# Patient Record
Sex: Female | Born: 1985 | Race: White | Hispanic: No | Marital: Married | Smoking: Former smoker
Health system: Southern US, Community
[De-identification: ages and names within clinical notes are randomized; demographics above are authoritative.]

## PROBLEM LIST (undated history)

## (undated) ENCOUNTER — Inpatient Hospital Stay (HOSPITAL_COMMUNITY): Payer: 59

## (undated) DIAGNOSIS — R0602 Shortness of breath: Secondary | ICD-10-CM

## (undated) DIAGNOSIS — Z309 Encounter for contraceptive management, unspecified: Secondary | ICD-10-CM

## (undated) DIAGNOSIS — M549 Dorsalgia, unspecified: Secondary | ICD-10-CM

## (undated) DIAGNOSIS — R110 Nausea: Secondary | ICD-10-CM

## (undated) DIAGNOSIS — IMO0001 Reserved for inherently not codable concepts without codable children: Secondary | ICD-10-CM

## (undated) DIAGNOSIS — R42 Dizziness and giddiness: Secondary | ICD-10-CM

## (undated) DIAGNOSIS — R109 Unspecified abdominal pain: Secondary | ICD-10-CM

## (undated) DIAGNOSIS — D1800 Hemangioma unspecified site: Secondary | ICD-10-CM

## (undated) DIAGNOSIS — F329 Major depressive disorder, single episode, unspecified: Secondary | ICD-10-CM

## (undated) HISTORY — DX: Hemangioma unspecified site: D18.00

## (undated) HISTORY — DX: Dorsalgia, unspecified: M54.9

## (undated) HISTORY — DX: Shortness of breath: R06.02

## (undated) HISTORY — DX: Encounter for contraceptive management, unspecified: Z30.9

## (undated) HISTORY — DX: Unspecified abdominal pain: R10.9

## (undated) HISTORY — DX: Reserved for inherently not codable concepts without codable children: IMO0001

## (undated) HISTORY — DX: Nausea: R11.0

## (undated) HISTORY — PX: WISDOM TOOTH EXTRACTION: SHX21

## (undated) HISTORY — DX: Major depressive disorder, single episode, unspecified: F32.9

## (undated) HISTORY — DX: Dizziness and giddiness: R42

---

## 2006-10-15 ENCOUNTER — Other Ambulatory Visit: Admission: RE | Admit: 2006-10-15 | Discharge: 2006-10-15 | Payer: Self-pay | Admitting: Obstetrics and Gynecology

## 2007-10-26 ENCOUNTER — Ambulatory Visit (HOSPITAL_COMMUNITY): Admission: RE | Admit: 2007-10-26 | Discharge: 2007-10-26 | Payer: Self-pay | Admitting: Obstetrics & Gynecology

## 2007-11-02 ENCOUNTER — Other Ambulatory Visit: Admission: RE | Admit: 2007-11-02 | Discharge: 2007-11-02 | Payer: Self-pay | Admitting: Obstetrics and Gynecology

## 2008-11-07 ENCOUNTER — Other Ambulatory Visit: Admission: RE | Admit: 2008-11-07 | Discharge: 2008-11-07 | Payer: Self-pay | Admitting: Obstetrics and Gynecology

## 2010-10-29 ENCOUNTER — Other Ambulatory Visit (HOSPITAL_COMMUNITY)
Admission: RE | Admit: 2010-10-29 | Discharge: 2010-10-29 | Disposition: A | Discharge status: Home / Self Care | Payer: PRIVATE HEALTH INSURANCE | Source: Ambulatory Visit | Attending: Obstetrics and Gynecology | Admitting: Obstetrics and Gynecology

## 2010-10-29 ENCOUNTER — Other Ambulatory Visit: Payer: Self-pay | Admitting: Adult Health

## 2010-10-29 DIAGNOSIS — Z01419 Encounter for gynecological examination (general) (routine) without abnormal findings: Secondary | ICD-10-CM | POA: Insufficient documentation

## 2012-03-09 ENCOUNTER — Other Ambulatory Visit: Payer: Self-pay | Admitting: Adult Health

## 2012-03-09 ENCOUNTER — Other Ambulatory Visit (HOSPITAL_COMMUNITY)
Admission: RE | Admit: 2012-03-09 | Discharge: 2012-03-09 | Disposition: A | Discharge status: Home / Self Care | Payer: BC Managed Care – PPO | Source: Ambulatory Visit | Attending: Obstetrics and Gynecology | Admitting: Obstetrics and Gynecology

## 2012-03-09 DIAGNOSIS — Z01419 Encounter for gynecological examination (general) (routine) without abnormal findings: Secondary | ICD-10-CM | POA: Insufficient documentation

## 2012-04-02 ENCOUNTER — Other Ambulatory Visit: Payer: Self-pay | Admitting: Adult Health

## 2012-09-25 ENCOUNTER — Telehealth: Payer: Self-pay | Admitting: Adult Health

## 2012-09-25 MED ORDER — NITROFURANTOIN MONOHYD MACRO 100 MG PO CAPS: 100.0000 mg | ORAL_CAPSULE | Freq: Two times a day (BID) | ORAL | Status: DC

## 2012-09-25 NOTE — Telephone Encounter (Signed)
Complains of UTI rx macrobid

## 2013-03-15 ENCOUNTER — Encounter (INDEPENDENT_AMBULATORY_CARE_PROVIDER_SITE_OTHER): Payer: Self-pay

## 2013-03-15 ENCOUNTER — Other Ambulatory Visit: Payer: Self-pay | Admitting: Adult Health

## 2013-03-29 ENCOUNTER — Other Ambulatory Visit: Payer: Self-pay | Admitting: Adult Health

## 2013-04-01 ENCOUNTER — Other Ambulatory Visit: Payer: Self-pay | Admitting: Obstetrics and Gynecology

## 2013-04-19 ENCOUNTER — Other Ambulatory Visit: Payer: Self-pay | Admitting: Adult Health

## 2013-05-03 ENCOUNTER — Encounter: Payer: Self-pay | Admitting: Adult Health

## 2013-05-03 ENCOUNTER — Ambulatory Visit (INDEPENDENT_AMBULATORY_CARE_PROVIDER_SITE_OTHER): Payer: 59 | Admitting: Adult Health

## 2013-05-03 VITALS — BP 120/76 | HR 74 | Ht 65.0 in | Wt 174.0 lb

## 2013-05-03 DIAGNOSIS — Z01419 Encounter for gynecological examination (general) (routine) without abnormal findings: Secondary | ICD-10-CM

## 2013-05-03 DIAGNOSIS — Z3202 Encounter for pregnancy test, result negative: Secondary | ICD-10-CM

## 2013-05-03 DIAGNOSIS — Z309 Encounter for contraceptive management, unspecified: Secondary | ICD-10-CM

## 2013-05-03 DIAGNOSIS — Z3200 Encounter for pregnancy test, result unknown: Secondary | ICD-10-CM

## 2013-05-03 HISTORY — DX: Encounter for contraceptive management, unspecified: Z30.9

## 2013-05-03 LAB — POCT URINE PREGNANCY: Preg Test, Ur: NEGATIVE

## 2013-05-03 MED ORDER — NORGESTIM-ETH ESTRAD TRIPHASIC 0.18/0.215/0.25 MG-35 MCG PO TABS
ORAL_TABLET | ORAL | Status: DC
Start: 2013-05-03 — End: 2014-04-06

## 2013-05-03 NOTE — Progress Notes (Signed)
Patient ID: Gloria Alvarado, female   DOB: 1985-01-29, 28 y.o.   MRN: [id number] History of Present Illness: Gloria Alvarado is a 28 year old white female, married in for a physical,she had a normal pap 03/09/12.She has been off OCs due to pharmacy issue.   Current Medications, Allergies, Past Medical History, Past Surgical History, Family History and Social History were reviewed in [employer] record.     Review of Systems: Patient denies any headaches, blurred vision, shortness of breath, chest pain, abdominal pain, problems with bowel movements, urination, or intercourse.  No joint pain or mood swings, has had some occasional dizziness with changing positions. Had area right side,felt like mobile nodule, not there now.  Physical Exam:BP 120/76  Pulse 74  Ht 5\' 5"  (1.651 m)  Wt 174 lb (78.926 kg)  BMI 28.96 kg/m2  LMP 04/20/2015UPT negative General:  Well developed, well nourished, no acute distress Skin:  Warm and dry Neck:  Midline trachea, normal thyroid Lungs; Clear to auscultation bilaterally Breast:  No dominant palpable mass, retraction, or nipple discharge Cardiovascular: Regular rate and rhythm Abdomen:  Soft, non tender, no hepatosplenomegaly Pelvic:  External genitalia is normal in appearance.  The vagina is normal in appearance, except for period like blood.. The cervix is tiny.  Uterus is felt to be normal size, shape, and contour.  No                adnexal masses or tenderness noted. Extremities:  No swelling or varicosities noted Psych:  No mood changes, alert and cooperative, seems happy   Impression: Yearly gyn exam no pap Contraceptive management    Plan: Refilled tri sprintec x 1 year OK to start today use backup x 1 month Physical in 1 year and pap then

## 2013-05-03 NOTE — Patient Instructions (Signed)
Physical in  1 year  Start OCs today

## 2013-06-11 ENCOUNTER — Telehealth: Payer: Self-pay | Admitting: Adult Health

## 2013-06-11 MED ORDER — METRONIDAZOLE 500 MG PO TABS: 500.0000 mg | ORAL_TABLET | Freq: Two times a day (BID) | ORAL | Status: DC

## 2013-06-11 NOTE — Telephone Encounter (Signed)
Complains of BV like symptoms will rx flagyl

## 2014-04-06 ENCOUNTER — Other Ambulatory Visit: Payer: Self-pay | Admitting: Adult Health

## 2014-04-07 ENCOUNTER — Telehealth: Payer: Self-pay | Admitting: Adult Health

## 2014-04-07 NOTE — Telephone Encounter (Signed)
Left message letting pt know birth control refill was sent to pharmacy today. [place]

## 2014-04-25 ENCOUNTER — Other Ambulatory Visit: Payer: Self-pay | Admitting: Adult Health

## 2014-05-09 ENCOUNTER — Other Ambulatory Visit: Payer: Self-pay | Admitting: Adult Health

## 2014-05-23 ENCOUNTER — Other Ambulatory Visit (HOSPITAL_COMMUNITY)
Admission: RE | Admit: 2014-05-23 | Discharge: 2014-05-23 | Disposition: A | Discharge status: Home / Self Care | Payer: 59 | Source: Ambulatory Visit | Attending: Obstetrics and Gynecology | Admitting: Obstetrics and Gynecology

## 2014-05-23 ENCOUNTER — Ambulatory Visit (INDEPENDENT_AMBULATORY_CARE_PROVIDER_SITE_OTHER): Payer: 59 | Admitting: Adult Health

## 2014-05-23 ENCOUNTER — Encounter: Payer: Self-pay | Admitting: Adult Health

## 2014-05-23 VITALS — BP 108/78 | HR 64 | Ht 65.0 in | Wt 176.0 lb

## 2014-05-23 DIAGNOSIS — Z01419 Encounter for gynecological examination (general) (routine) without abnormal findings: Secondary | ICD-10-CM | POA: Diagnosis present

## 2014-05-23 DIAGNOSIS — Z3041 Encounter for surveillance of contraceptive pills: Secondary | ICD-10-CM

## 2014-05-23 MED ORDER — NORGESTIM-ETH ESTRAD TRIPHASIC 0.18/0.215/0.25 MG-35 MCG PO TABS: 1.0000 | ORAL_TABLET | Freq: Every day | ORAL | Status: DC

## 2014-05-23 NOTE — Progress Notes (Signed)
Patient ID: Gloria Alvarado, female   DOB: November 26, 1985, 29 y.o.   MRN: [id number] History of Present Illness:  Gloria Alvarado is a 29 year old white female,married in for well woman gyn exam and pap.  Current Medications, Allergies, Past Medical History, Past Surgical History, Family History and Social History were reviewed in [employer] record.     Review of Systems: Patient denies any headaches, hearing loss, fatigue, blurred vision, shortness of breath, chest pain, abdominal pain, problems with bowel movements, urination, or intercourse. No joint pain or mood swings.She may want to try to get pregnant in the Fall.    Physical Exam:BP 108/78 mmHg  Pulse 64  Ht 5\' 5"  (1.651 m)  Wt 176 lb (79.833 kg)  BMI 29.29 kg/m2  LMP 04/29/2014 General:  Well developed, well nourished, no acute distress Skin:  Warm and dry Neck:  Midline trachea, normal thyroid, good ROM, no lymphadenopathy Lungs; Clear to auscultation bilaterally Breast:  No dominant palpable mass, retraction, or nipple discharge Cardiovascular: Regular rate and rhythm Abdomen:  Soft, non tender, no hepatosplenomegaly Pelvic:  External genitalia is normal in appearance, no lesions.  The vagina is normal in appearance. Urethra has no lesions or masses. The cervix is smooth, pap perofrmed.  Uterus is felt to be normal size, shape, and contour.  No adnexal masses or tenderness noted.Bladder is non tender, no masses felt. Extremities/musculoskeletal:  No swelling or varicosities noted, no clubbing or cyanosis Psych:  No mood changes, alert and cooperative,seems happy   Impression: Well woman gyn exam with pap Contraceptive management    Plan: Refilled tri sprintec x 1 year Physical in  1 year, pap at 30 Take folic acid or OTC PNV Stop OCs 3 months before starts trying to get pregnant, and have sex every other day day 7-24 of cycle, void before sex and lay there after sex

## 2014-05-23 NOTE — Patient Instructions (Signed)
Take folic acid  Physical in 1 year Pap at 30

## 2014-05-25 LAB — CYTOLOGY - PAP

## 2015-04-03 ENCOUNTER — Other Ambulatory Visit: Payer: Self-pay | Admitting: Adult Health

## 2015-04-17 ENCOUNTER — Ambulatory Visit (INDEPENDENT_AMBULATORY_CARE_PROVIDER_SITE_OTHER): Payer: 59 | Admitting: Adult Health

## 2015-04-17 ENCOUNTER — Ambulatory Visit (HOSPITAL_COMMUNITY)
Admission: RE | Admit: 2015-04-17 | Discharge: 2015-04-17 | Disposition: A | Discharge status: Home / Self Care | Payer: PRIVATE HEALTH INSURANCE | Source: Ambulatory Visit | Attending: Adult Health | Admitting: Adult Health

## 2015-04-17 ENCOUNTER — Encounter: Payer: Self-pay | Admitting: Adult Health

## 2015-04-17 VITALS — BP 132/82 | HR 76 | Ht 66.0 in | Wt 182.0 lb

## 2015-04-17 DIAGNOSIS — R109 Unspecified abdominal pain: Secondary | ICD-10-CM

## 2015-04-17 DIAGNOSIS — R143 Flatulence: Secondary | ICD-10-CM | POA: Diagnosis not present

## 2015-04-17 DIAGNOSIS — R110 Nausea: Secondary | ICD-10-CM | POA: Diagnosis not present

## 2015-04-17 DIAGNOSIS — Z3202 Encounter for pregnancy test, result negative: Secondary | ICD-10-CM

## 2015-04-17 DIAGNOSIS — R0602 Shortness of breath: Secondary | ICD-10-CM

## 2015-04-17 DIAGNOSIS — F329 Major depressive disorder, single episode, unspecified: Secondary | ICD-10-CM | POA: Diagnosis not present

## 2015-04-17 DIAGNOSIS — R1084 Generalized abdominal pain: Secondary | ICD-10-CM

## 2015-04-17 DIAGNOSIS — R42 Dizziness and giddiness: Secondary | ICD-10-CM | POA: Diagnosis not present

## 2015-04-17 DIAGNOSIS — F32A Depression, unspecified: Secondary | ICD-10-CM

## 2015-04-17 DIAGNOSIS — IMO0001 Reserved for inherently not codable concepts without codable children: Secondary | ICD-10-CM

## 2015-04-17 DIAGNOSIS — F418 Other specified anxiety disorders: Secondary | ICD-10-CM | POA: Insufficient documentation

## 2015-04-17 HISTORY — DX: Depression, unspecified: F32.A

## 2015-04-17 HISTORY — DX: Nausea: R11.0

## 2015-04-17 HISTORY — DX: Reserved for inherently not codable concepts without codable children: IMO0001

## 2015-04-17 HISTORY — DX: Shortness of breath: R06.02

## 2015-04-17 HISTORY — DX: Unspecified abdominal pain: R10.9

## 2015-04-17 HISTORY — DX: Dizziness and giddiness: R42

## 2015-04-17 LAB — POCT URINE PREGNANCY: Preg Test, Ur: NEGATIVE

## 2015-04-17 MED ORDER — ESCITALOPRAM OXALATE 10 MG PO TABS: 10.0000 mg | ORAL_TABLET | Freq: Every day | ORAL | Status: DC

## 2015-04-17 NOTE — Patient Instructions (Addendum)
Take lexapro daily Follow up in 1 week  Will talk when labs back Get chest xray and [place] Go to ER if worse

## 2015-04-17 NOTE — Progress Notes (Signed)
Subjective:     Patient ID: Gloria Alvarado, female   DOB: 07-07-1985, 30 y.o.   MRN: [id number]  HPI Gloria Alvarado is a 30 year old white female worked in today, complaining of dizzy and head feels cloudy for 4-5 months, has had sinus issues and allergies, and has nausea and stomach pain and cramps for 4-5 weeks, has had gas and some heartburn and feels short of breath at times for 1-2 weeks, BMs are more often, has not seen and blood. Has normal stress, nothing new. Has had bleeding at navel for last week or so.   Review of Systems Patient denies any headaches, hearing loss, fatigue, blurred vision, chest pain, problems with bowel movements, urination, or intercourse. No joint pain or mood swings. See HPI for positives. Reviewed past medical,surgical, social and family history. Reviewed medications and allergies.     Objective:   Physical Exam BP 132/82 mmHg  Pulse 76  Ht 5\' 6"  (QA348G m)  Wt 182 lb (82.555 kg)  BMI 29.39 kg/m2  LMP 04/05/2015 UPT negative Skin warm and dry. Neck: mid line trachea, normal thyroid, good ROM, no lymphadenopathy noted. Lungs: clear to ausculation bilaterally. Cardiovascular: regular rate and rhythm. No sinus tenderness CN 2-12 intact, Abdomen soft and non tender, no HSM, has old blood in navel, no odor. PHQ 9 score 12, denies suicidal ideations. Discussed that will need to check labs and get [place] and chest xray, if all negative may get cardiology consult, but will treat depression with lexapro to see if any other symptoms improve. Face time 25 minutes, with 50 % get tests scheduled and counseling.    Assessment:     Nausea Dizzy spells Abdominal pain Short of breath Gas Depression     Plan:     Check CBC,CMP,TSH and amylase and lipase Abdominal and pelvic US 4/5 at 2 pm at [facility] Get chest xray today Rx Lexapro 10 mg 330 take 1 daily with 6 refills Follow up in 1 week If gets worse go to ER

## 2015-04-18 ENCOUNTER — Telehealth: Payer: Self-pay | Admitting: Adult Health

## 2015-04-18 LAB — COMPREHENSIVE METABOLIC PANEL
A/G RATIO: 1.5 (ref 1.2–2.2)
ALK PHOS: 75 IU/L (ref 39–117)
ALT: 23 IU/L (ref 0–32)
AST: 18 IU/L (ref 0–40)
Albumin: 4.4 g/dL (ref 3.5–5.5)
BILIRUBIN TOTAL: 0.6 mg/dL (ref 0.0–1.2)
BUN/Creatinine Ratio: 11 (ref 9–23)
BUN: 8 mg/dL (ref 6–20)
CHLORIDE: 100 mmol/L (ref 96–106)
CO2: 21 mmol/L (ref 18–29)
Calcium: 9.3 mg/dL (ref 8.7–10.2)
Creatinine, Ser: 0.72 mg/dL (ref 0.57–1.00)
GFR calc Af Amer: 131 mL/min/{1.73_m2} (ref >59)
GFR calc non Af Amer: 114 mL/min/{1.73_m2} (ref >59)
GLUCOSE: 74 mg/dL (ref 65–99)
Globulin, Total: 3 g/dL (ref 1.5–4.5)
POTASSIUM: 4.2 mmol/L (ref 3.5–5.2)
Sodium: 141 mmol/L (ref 134–144)
Total Protein: 7.4 g/dL (ref 6.0–8.5)

## 2015-04-18 LAB — TSH: TSH: 3.01 u[IU]/mL (ref 0.450–4.500)

## 2015-04-18 LAB — CBC
Hematocrit: 40.8 % (ref 34.0–46.6)
Hemoglobin: 13.9 g/dL (ref 11.1–15.9)
MCH: 30.5 pg (ref 26.6–33.0)
MCHC: 34.1 g/dL (ref 31.5–35.7)
MCV: 90 fL (ref 79–97)
PLATELETS: 250 10*3/uL (ref 150–379)
RBC: 4.55 x10E6/uL (ref 3.77–5.28)
RDW: 13.2 % (ref 12.3–15.4)
WBC: 5.6 10*3/uL (ref 3.4–10.8)

## 2015-04-18 LAB — AMYLASE: Amylase: 76 U/L (ref 31–124)

## 2015-04-18 LAB — LIPASE: LIPASE: 23 U/L (ref 0–59)

## 2015-04-18 NOTE — Telephone Encounter (Signed)
Pt aware chest xray and labs all normal, has [place] tomorrow

## 2015-04-19 ENCOUNTER — Ambulatory Visit (HOSPITAL_COMMUNITY)
Admission: RE | Admit: 2015-04-19 | Discharge: 2015-04-19 | Disposition: A | Discharge status: Home / Self Care | Payer: 59 | Source: Ambulatory Visit | Attending: Adult Health | Admitting: Adult Health

## 2015-04-19 ENCOUNTER — Ambulatory Visit (HOSPITAL_COMMUNITY): Payer: 59

## 2015-04-19 DIAGNOSIS — R110 Nausea: Secondary | ICD-10-CM | POA: Diagnosis present

## 2015-04-19 DIAGNOSIS — D1800 Hemangioma unspecified site: Secondary | ICD-10-CM | POA: Diagnosis not present

## 2015-04-19 DIAGNOSIS — R1084 Generalized abdominal pain: Secondary | ICD-10-CM | POA: Diagnosis present

## 2015-04-20 ENCOUNTER — Telehealth: Payer: Self-pay | Admitting: Adult Health

## 2015-04-20 DIAGNOSIS — R0602 Shortness of breath: Secondary | ICD-10-CM

## 2015-04-20 DIAGNOSIS — D1803 Hemangioma of intra-abdominal structures: Secondary | ICD-10-CM

## 2015-04-20 NOTE — Telephone Encounter (Signed)
Pt aware of [place] results,has some heartburn, try prilosec, wil recheck abd Korea in 1 year and will refer to cardiology for shortness of breath evaluation

## 2015-04-24 ENCOUNTER — Encounter: Payer: Self-pay | Admitting: Adult Health

## 2015-04-24 ENCOUNTER — Ambulatory Visit (INDEPENDENT_AMBULATORY_CARE_PROVIDER_SITE_OTHER): Payer: 59 | Admitting: Adult Health

## 2015-04-24 VITALS — BP 120/70 | HR 64 | Ht 66.0 in | Wt 182.5 lb

## 2015-04-24 DIAGNOSIS — F329 Major depressive disorder, single episode, unspecified: Secondary | ICD-10-CM | POA: Diagnosis not present

## 2015-04-24 DIAGNOSIS — R0602 Shortness of breath: Secondary | ICD-10-CM | POA: Diagnosis not present

## 2015-04-24 DIAGNOSIS — R12 Heartburn: Secondary | ICD-10-CM | POA: Insufficient documentation

## 2015-04-24 DIAGNOSIS — D1803 Hemangioma of intra-abdominal structures: Secondary | ICD-10-CM

## 2015-04-24 DIAGNOSIS — F32A Depression, unspecified: Secondary | ICD-10-CM

## 2015-04-24 DIAGNOSIS — R110 Nausea: Secondary | ICD-10-CM | POA: Diagnosis not present

## 2015-04-24 NOTE — Progress Notes (Signed)
Subjective:     Patient ID: Gloria Alvarado, female   DOB: 1985-05-09, 30 y.o.   MRN: [id number]  HPI Gloria Alvarado is a 30 year old white female, married back in follow up of having multiple symptoms, last week, such as heartburn,which is better since taking prilosec, but still has nausea in am, still has some shortness of breath, but thinks it is better too.   Review of Systems  Patient denies any headaches, hearing loss, fatigue, blurred vision, chest pain, abdominal pain, problems with bowel movements, urination, or intercourse. No joint pain or mood swings.See HPI for positives. Reviewed past medical,surgical, social and family history. Reviewed medications and allergies.     Objective:   Physical Exam BP 120/70 mmHg  Pulse 64  Ht 5\' 6"  (1.676 m)  Wt 182 lb 8 oz (82.781 kg)  BMI 29.47 kg/m2  LMP 04/05/2015 Skin warm and dry. Lungs: clear to ausculation bilaterally. Cardiovascular: regular rate and rhythm.She says she is feeling better, but still has nausea in am, reviewed [place] and chest xray again with her and her Mom this time, called cardiology as she has not heard from them yet about appt.Will get [place] of liver in 1 year to assess hemangioma of liver stability and if heartburn and nausea do not resolve will check liver function. Face time 15 minutes with 50% counseling.    Assessment:     Heartburn symptoms Nausea Shortness of breath Depression    Hemangioma of liver   Plan:     Continue lexapro Continue prilosec, if not better after 7 days can increase to 20 mg and let me know Keep appt 5/15 for physical Awaiting call from cardiology

## 2015-04-24 NOTE — Patient Instructions (Signed)
Keep taking lexapro  Continue prilosec Keep appt 5/15

## 2015-04-25 ENCOUNTER — Telehealth: Payer: Self-pay | Admitting: *Deleted

## 2015-04-26 ENCOUNTER — Ambulatory Visit (INDEPENDENT_AMBULATORY_CARE_PROVIDER_SITE_OTHER): Payer: 59 | Admitting: Cardiology

## 2015-04-26 ENCOUNTER — Encounter: Payer: Self-pay | Admitting: Cardiology

## 2015-04-26 VITALS — BP 110/64 | HR 86 | Ht 66.0 in | Wt 187.0 lb

## 2015-04-26 DIAGNOSIS — J302 Other seasonal allergic rhinitis: Secondary | ICD-10-CM | POA: Diagnosis not present

## 2015-04-26 DIAGNOSIS — R0602 Shortness of breath: Secondary | ICD-10-CM

## 2015-04-26 NOTE — Patient Instructions (Signed)
Your physician recommends that you schedule a follow-up appointment in: to be determined after echo    Your physician has requested that you have an echocardiogram. Echocardiography is a painless test that uses sound waves to create images of your heart. It provides your doctor with information about the size and shape of your heart and how well your heart's chambers and valves are working. This procedure takes approximately one hour. There are no restrictions for this procedure.         Thank you for choosing [facility] !         

## 2015-04-26 NOTE — Progress Notes (Signed)
Cardiology Office Note  Date: 04/26/2015   ID: Gloria Alvarado, DOB [birth year], MRN [id number]  PCP: Derrek Monaco, NP  Consulting Cardiologist: Rozann Lesches, MD   Chief Complaint  Patient presents with  . Shortness of Breath    History of Present Illness: Gloria Alvarado is a 30 y.o. female referred for cardiology consultation by Ms. Laurann Montana NP. I reviewed her records including office note from April 10 at OB/GYN visit. She is here today with her mother to discuss symptoms. Within the last few weeks she has felt a general sense that she needs to take a deep breath periodically. This can happen when she is seated, more noticeable when she is talking a lot to someone or active and moving around. It is almost as if she has to sigh relatively frequently. 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 She reports being under stress. She does not endorse any exertional chest pain. She occasionally feels somewhat lightheaded when she stands up quickly, has had no distinct palpitations or syncope.  We discussed her family history, no premature CAD, sudden cardiac death, known arrhythmias, or valvular heart disease. She does not have any personal history of elevated blood pressure, diabetes mellitus, or hyperlipidemia.  ECG done today shows sinus rhythm with borderline low voltage.  She has no definite history of asthma, but does report seasonal allergies and "congestion" at least twice a year. She has had recent trouble with allergy symptoms.  Past Medical History  Diagnosis Date  . Contraceptive management 05/03/2013  . Dizzy spells 04/17/2015  . Nausea 04/17/2015  . Abdominal pain 04/17/2015  . Shortness of breath 04/17/2015  . Gas 04/17/2015  . Depression 04/17/2015    Past Surgical History  Procedure Laterality Date  . Wisdom tooth extraction      Current Outpatient Prescriptions  Medication Sig Dispense Refill  . escitalopram (LEXAPRO) 10  MG tablet Take 1 tablet (10 mg total) by mouth daily. 30 tablet 6  . GuaiFENesin (MUCINEX PO) Take by mouth as needed.    Marland Kitchen ibuprofen (ADVIL,MOTRIN) 200 MG tablet Take 400 mg by mouth as needed.    . Omeprazole (PRILOSEC PO) Take by mouth daily.    . Pediatric Multiple Vit-C-FA (FLINSTONES GUMMIES OMEGA-3 DHA PO) Take by mouth. Takes 2 daily    . Probiotic Product (PROBIOTIC PO) Take by mouth daily.    . TRI-SPRINTEC 0.18/0.215/0.25 MG-35 MCG tablet TAKE ONE TABLET BY MOUTH ONCE DAILY 28 tablet 11  . UNABLE TO FIND Plexus drink-daily     No current facility-administered medications for this visit.   Allergies:  Sulfa antibiotics   Social History: The patient  reports that she quit smoking about 10 years ago. Her smoking use included Cigarettes. She started smoking about 15 years ago. She smoked 0.25 packs per day. She has never used smokeless tobacco. She reports that she drinks alcohol. She reports that she does not use illicit drugs.   Family History: The patient's family history includes Cancer in her maternal grandfather; Hyperlipidemia in her mother; Hypertension in her mother and sister; Prostate cancer in her father; Thyroid disease in her maternal grandmother.   ROS:  Please see the history of present illness. Otherwise, complete review of systems is positive for intermittent abdominal discomfort.  All other systems are reviewed and negative.   Physical Exam: VS:  BP 110/64 mmHg  Pulse 86  Ht 5\' 6"  (1.676 m)  Wt  187 lb (84.823 kg)  BMI 30.20 kg/m2  SpO2 98%  LMP 04/05/2015, BMI Body mass index is 30.2 kg/(m^2).  Wt Readings from Last 3 Encounters:  04/26/15 187 lb (84.823 kg)  04/24/15 182 lb 8 oz (82.781 kg)  04/17/15 182 lb (82.555 kg)    General: Overweight young woman, appears comfortable at rest. HEENT: Conjunctiva and lids normal, oropharynx clear. Neck: Supple, no elevated JVP or carotid bruits, no thyromegaly. Lungs: Clear to auscultation - no wheezing, nonlabored  breathing at rest. Cardiac: Regular rate and rhythm, no S3 or significant systolic murmur, no pericardial rub. Abdomen: Soft, nontender, bowel sounds present. Extremities: No pitting edema, distal pulses 2+. Skin: Warm and dry. Musculoskeletal: No kyphosis. Neuropsychiatric: Alert and oriented x3, affect grossly appropriate.  ECG: There is no old tracing for comparison.  Recent Labwork: 04/17/2015: ALT 23; AST 18; BUN 8; Creatinine, Ser 0.72; Platelets 250; Potassium 4.2; Sodium 141; TSH 3.010   Other Studies Reviewed Today:  Chest x-ray 04/17/2015: FINDINGS: The cardiomediastinal silhouette is unremarkable.  There is no evidence of focal airspace disease, pulmonary edema, suspicious pulmonary nodule/mass, pleural effusion, or pneumothorax. No acute bony abnormalities are identified.  IMPRESSION: No active cardiopulmonary disease.  Abdominal ultrasound 04/19/2015: FINDINGS: Gallbladder: No gallstones or wall thickening visualized. There is no pericholecystic fluid. No sonographic Murphy sign noted by sonographer.  Common bile duct: Diameter: 5 mm. There is no intrahepatic, common hepatic, or common bile duct dilatation.  Liver: There is an echogenic focus in the anterior segment of the right lobe of the liver measuring 1.1 x 2.4 x 1.6 cm. A second nearby echogenic focus measures 0.9 x 1.4 x 1.8 cm. No other focal liver lesions are evident. Within normal limits in parenchymal echogenicity.  IVC: No abnormality visualized.  Pancreas: No pancreatic mass or inflammatory focus.  Spleen: Size and appearance within normal limits.  Right Kidney: Length: 10.0 cm. Echogenicity within normal limits. No mass or hydronephrosis visualized.  Left Kidney: Length: 11.1 cm. Echogenicity within normal limits. No mass or hydronephrosis visualized.  Abdominal aorta: No aneurysm visualized.  Other findings: No demonstrable ascites.  IMPRESSION: Echogenic foci in the right  lobe of the liver, probably representing hemangiomas. It may be prudent to obtain a followup ultrasound of the liver in approximately 1 year to assess for stability.  Study otherwise unremarkable.  Assessment and Plan:  1. Relative sense of shortness of breath as outlined above. No exertional chest pain, palpitations, or syncope. ECG reviewed and nonspecific overall. She has no significant cardiac murmur on examination, no wheezing. Recent chest x-ray showed no acute process. We discussed her symptoms in detail today. Likelihood of ischemic heart disease as etiology would be quite low based on risk factor profile. From a cardiac perspective, main concerns at this point would be to exclude an undiagnosed cardiomyopathy or other structural cardiac abnormality as well as exclude pulmonary hypertension. This could be evaluated adequately by an echocardiogram which will be ordered. If this is reassuring, other possibilities can be considered in follow-up with Ms. Laurann Montana NP. Emperic treatment with antihistamine for allergy symptoms could be considered as well as possibly PFTs.  2. Reported intermittent allergy symptoms. I asked her to try over-the-counter Claritin to see it is making a difference in her symptoms.  Current medicines were reviewed with the patient today.   Orders Placed This Encounter  Procedures  . EKG 12-Lead  . Echocardiogram    Disposition: Call with results.   Signed, Satira Sark, MD, [facility] 04/26/2015 10:23  AM    [facility] at [facility] [address], [place], [place] [address] Phone: [phone]; Fax: [phone]

## 2015-05-03 ENCOUNTER — Other Ambulatory Visit (HOSPITAL_COMMUNITY): Payer: PRIVATE HEALTH INSURANCE

## 2015-05-08 NOTE — Telephone Encounter (Signed)
Done

## 2015-05-10 ENCOUNTER — Ambulatory Visit (HOSPITAL_COMMUNITY)
Admission: RE | Admit: 2015-05-10 | Discharge: 2015-05-10 | Disposition: A | Discharge status: Home / Self Care | Payer: PRIVATE HEALTH INSURANCE | Source: Ambulatory Visit | Attending: Cardiology | Admitting: Cardiology

## 2015-05-10 DIAGNOSIS — R0602 Shortness of breath: Secondary | ICD-10-CM | POA: Diagnosis present

## 2015-05-12 ENCOUNTER — Other Ambulatory Visit: Payer: Self-pay

## 2015-05-15 ENCOUNTER — Telehealth: Payer: Self-pay | Admitting: Adult Health

## 2015-05-15 DIAGNOSIS — K921 Melena: Secondary | ICD-10-CM

## 2015-05-15 NOTE — Telephone Encounter (Signed)
Feels better on prilosec, but has had blood in stool recently, and stomach hurts, will refer to Dr Laural Golden,

## 2015-05-16 ENCOUNTER — Encounter (INDEPENDENT_AMBULATORY_CARE_PROVIDER_SITE_OTHER): Payer: Self-pay | Admitting: *Deleted

## 2015-05-29 ENCOUNTER — Ambulatory Visit (INDEPENDENT_AMBULATORY_CARE_PROVIDER_SITE_OTHER): Payer: 59 | Admitting: Internal Medicine

## 2015-05-29 ENCOUNTER — Other Ambulatory Visit: Payer: 59 | Admitting: Adult Health

## 2015-05-29 ENCOUNTER — Encounter (INDEPENDENT_AMBULATORY_CARE_PROVIDER_SITE_OTHER): Payer: Self-pay | Admitting: Internal Medicine

## 2015-05-29 VITALS — BP 112/72 | HR 72 | Temp 98.2°F | Ht 66.0 in | Wt 182.0 lb

## 2015-05-29 DIAGNOSIS — R1011 Right upper quadrant pain: Secondary | ICD-10-CM | POA: Diagnosis not present

## 2015-05-29 NOTE — Progress Notes (Addendum)
Subjective:    Patient ID: Gloria Alvarado, female    DOB: [birth year], 30 y.o.   MRN: [id number]  HPI Referred by Derrek Monaco for rectal bleeding. She has noticed in the last few months. She noticed while wiping. She did have some rectal bleeding this morning. Usually has a BM daily. No pain with the rectal bleeding.  She now has noticed on the stool.  She says she has also seen blood in the toilet. No pain with the rectal bleeding. She also says she has left upper quadrant pain. She occasionally has a stabbing pain in her rt upper quadrant. She has seen Derrek Monaco NP. She was started on Prilosec. The Prilosec helped with the nausea and GERD. She has a lot of back pain at night. She also has tenderness to the touch at times. Back pain x 1 month.  Family hx of GB. Mother had her GB removed 35-40. Sister at age 31.  She does not eat past 7pm.  There has been no weight loss. No family hx of colon cancer or inflammatory bowel disease  CBC    Component Value Date/Time   WBC 5.6 04/17/2015 1316   RBC 4.55 04/17/2015 1316   HCT 40.8 04/17/2015 1316   PLT 250 04/17/2015 1316   MCV 90 04/17/2015 1316   MCH 30.5 04/17/2015 1316   MCHC 34.1 04/17/2015 1316   RDW 13.2 04/17/2015 1316       Review of Systems Past Medical History  Diagnosis Date  . Contraceptive management 05/03/2013  . Dizzy spells 04/17/2015  . Nausea 04/17/2015  . Abdominal pain 04/17/2015  . Shortness of breath 04/17/2015  . Gas 04/17/2015  . Depression 04/17/2015    Past Surgical History  Procedure Laterality Date  . Wisdom tooth extraction      Allergies  Allergen Reactions  . Sulfa Antibiotics Shortness Of Breath, Swelling and Other (See Comments)    Throat swelling, shortness of breath.    Current Outpatient Prescriptions on File Prior to Visit  Medication Sig Dispense Refill  . escitalopram (LEXAPRO) 10 MG tablet Take 1 tablet (10 mg total) by mouth daily. 30 tablet 6  . GuaiFENesin (MUCINEX PO) Take  by mouth as needed.    Marland Kitchen ibuprofen (ADVIL,MOTRIN) 200 MG tablet Take 400 mg by mouth as needed.    . Omeprazole (PRILOSEC PO) Take by mouth daily.    . Pediatric Multiple Vit-C-FA (FLINSTONES GUMMIES OMEGA-3 DHA PO) Take by mouth. Takes 2 daily    . Probiotic Product (PROBIOTIC PO) Take by mouth daily.    . TRI-SPRINTEC 0.18/0.215/0.25 MG-35 MCG tablet TAKE ONE TABLET BY MOUTH ONCE DAILY 28 tablet 11  . UNABLE TO FIND Plexus drink-daily     No current facility-administered medications on file prior to visit.        Objective:   Physical Exam Blood pressure 112/72, pulse 72, temperature 98.2 F (36.8 C), height 5\' 6"  (1.676 m), weight 182 lb (82.555 kg).  Alert and oriented. Skin warm and dry. Oral mucosa is moist.   . Sclera anicteric, conjunctivae is pink. Thyroid not enlarged. No cervical lymphadenopathy. Lungs clear. Heart regular rate and rhythm.  Abdomen is soft. Bowel sounds are positive. No hepatomegaly. No abdominal masses felt. No tenderness.  No edema to lower extremities.  Stool brown and guaiac negative.   Lot [id number] Ex 9/17     Assessment & Plan:  Rectal bleeding. Stool was negative. Am going to send three stool cards home with her.  If any positive, may need sigmoidoscopy.  Upper abdominal pain. Prilosec has helped. Needs to rule out GB disease. Family hx of GB disease.  Will get a HIDA scan.

## 2015-05-29 NOTE — Patient Instructions (Signed)
Hida scan

## 2015-06-05 ENCOUNTER — Encounter: Payer: Self-pay | Admitting: Adult Health

## 2015-06-05 ENCOUNTER — Ambulatory Visit (INDEPENDENT_AMBULATORY_CARE_PROVIDER_SITE_OTHER): Payer: 59 | Admitting: Adult Health

## 2015-06-05 ENCOUNTER — Encounter (HOSPITAL_COMMUNITY): Payer: Self-pay

## 2015-06-05 ENCOUNTER — Encounter (HOSPITAL_COMMUNITY)
Admission: RE | Admit: 2015-06-05 | Discharge: 2015-06-05 | Disposition: A | Discharge status: Home / Self Care | Payer: 59 | Source: Ambulatory Visit | Attending: Internal Medicine | Admitting: Internal Medicine

## 2015-06-05 VITALS — BP 110/72 | HR 80 | Ht 65.0 in | Wt 182.5 lb

## 2015-06-05 DIAGNOSIS — F329 Major depressive disorder, single episode, unspecified: Secondary | ICD-10-CM

## 2015-06-05 DIAGNOSIS — R1011 Right upper quadrant pain: Secondary | ICD-10-CM | POA: Insufficient documentation

## 2015-06-05 DIAGNOSIS — Z3041 Encounter for surveillance of contraceptive pills: Secondary | ICD-10-CM

## 2015-06-05 DIAGNOSIS — R1084 Generalized abdominal pain: Secondary | ICD-10-CM

## 2015-06-05 DIAGNOSIS — Z01411 Encounter for gynecological examination (general) (routine) with abnormal findings: Secondary | ICD-10-CM

## 2015-06-05 DIAGNOSIS — M545 Low back pain, unspecified: Secondary | ICD-10-CM

## 2015-06-05 DIAGNOSIS — M549 Dorsalgia, unspecified: Secondary | ICD-10-CM | POA: Insufficient documentation

## 2015-06-05 DIAGNOSIS — F32A Depression, unspecified: Secondary | ICD-10-CM

## 2015-06-05 DIAGNOSIS — Z01419 Encounter for gynecological examination (general) (routine) without abnormal findings: Secondary | ICD-10-CM

## 2015-06-05 HISTORY — DX: Dorsalgia, unspecified: M54.9

## 2015-06-05 MED ORDER — CYCLOBENZAPRINE HCL 10 MG PO TABS: 10.0000 mg | ORAL_TABLET | Freq: Three times a day (TID) | ORAL | Status: DC | PRN

## 2015-06-05 MED ORDER — TECHNETIUM TC 99M MEBROFENIN IV KIT
5.0000 | PACK | Freq: Once | INTRAVENOUS | Status: AC | PRN
  Administered 2015-06-05: 5 via INTRAVENOUS

## 2015-06-05 NOTE — Progress Notes (Signed)
Patient ID: Gloria Alvarado, female   DOB: 09/26/1985, 30 y.o.   MRN: [id number] History of Present Illness: Gloria Alvarado is a 30 year old white female, married in for a well woman gyn exam, she had a normal pap 05/23/14.She is feeling better, but still has upper abdominal pain at times and now low left sided back pain, esp at night,and it throbs.Has seen cardiology and had negative work up, and was seen at  Dr Olevia Perches office, with Deberah Castle, NP, and had gallbladder function scan this am.Still has some nausea but better and still has painless red rectal bleeding, is doing 3 hemoccult cards for Dr Olevia Perches office.She says she is better she thinks on lexapro and is happy with her OCs.    Current Medications, Allergies, Past Medical History, Past Surgical History, Family History and Social History were reviewed in [employer] record.     Review of Systems: Patient denies any headaches, hearing loss, fatigue, blurred vision, shortness of breath, chest pain, problems with bowel movements, urination, or intercourse. No joint pain or mood swings. See HPI for positives.   Physical Exam:BP 110/72 mmHg  Pulse 80  Ht 5\' 5"  (1.651 m)  Wt 182 lb 8 oz (82.781 kg)  BMI 30.37 kg/m2  LMP 05/29/2015 (Approximate) General:  Well developed, well nourished, no acute distress Skin:  Warm and dry,no rashes Neck:  Midline trachea, normal thyroid, good ROM, no lymphadenopathy Lungs; Clear to auscultation bilaterally Breast:  No dominant palpable mass, retraction, or nipple discharge Cardiovascular: Regular rate and rhythm Abdomen:  Soft, mildly tender,RUQ, no hepatosplenomegaly Pelvic:  External genitalia is normal in appearance, no lesions.  The vagina is normal in appearance. Urethra has no lesions or masses. The cervix is everted at os.  Uterus is felt to be normal size, shape, and contour.  No adnexal masses or tenderness noted.Bladder is non tender, no masses felt. Extremities/musculoskeletal:   No swelling or varicosities noted, no clubbing or cyanosis Psych:  No mood changes, alert and cooperative,seems happy No point tenderness on back  Impression: Well woman gyn exam no pap Contraceptive management Depression Left side back pain Abdominal pain    Plan: Rx flexeril 10 mg #30 take 1 tid prn with 1 refill Use pillows between legs Return in 3 months for ROS or before if needed  Continue lexapro Continue tri sprintec, has refills Physical in 1 year, pap in 2019 Follow up with Dr Laural Golden for abdominal pain and rectal bleeding

## 2015-06-05 NOTE — Patient Instructions (Signed)
Continue OCs and lexapro Follow up in 3 months Physical in 1 year

## 2015-06-07 ENCOUNTER — Ambulatory Visit (INDEPENDENT_AMBULATORY_CARE_PROVIDER_SITE_OTHER): Payer: PRIVATE HEALTH INSURANCE | Admitting: Internal Medicine

## 2015-06-08 ENCOUNTER — Telehealth (INDEPENDENT_AMBULATORY_CARE_PROVIDER_SITE_OTHER): Payer: Self-pay | Admitting: *Deleted

## 2015-06-08 NOTE — Telephone Encounter (Signed)
   Diagnosis:    Result(s)   Card 1: Positive    Card 2:Negative:   Card 3:Negative:    Completed by: Karima Carrell,LPN   HEMOCCULT SENSA DEVELOPER: LOT#:  [id number] EXPIRATION DATE: 9-17   HEMOCCULT SENSA CARD:  LOT#:  [id number] EXPIRATION DATE: 12-18   CARD CONTROL RESULTS:  POSITIVE: Positive NEGATIVE: Negative    ADDITIONAL COMMENTS: Results forwarded to [place] for review.

## 2015-06-09 ENCOUNTER — Other Ambulatory Visit (INDEPENDENT_AMBULATORY_CARE_PROVIDER_SITE_OTHER): Payer: Self-pay | Admitting: Internal Medicine

## 2015-06-09 ENCOUNTER — Telehealth (INDEPENDENT_AMBULATORY_CARE_PROVIDER_SITE_OTHER): Payer: Self-pay | Admitting: Internal Medicine

## 2015-06-09 ENCOUNTER — Encounter (INDEPENDENT_AMBULATORY_CARE_PROVIDER_SITE_OTHER): Payer: Self-pay | Admitting: *Deleted

## 2015-06-09 ENCOUNTER — Other Ambulatory Visit (INDEPENDENT_AMBULATORY_CARE_PROVIDER_SITE_OTHER): Payer: Self-pay | Admitting: *Deleted

## 2015-06-09 DIAGNOSIS — R195 Other fecal abnormalities: Secondary | ICD-10-CM

## 2015-06-09 NOTE — Telephone Encounter (Signed)
Gloria Alvarado, Colonoscopy. I have talked with patient.

## 2015-06-09 NOTE — Telephone Encounter (Signed)
Patient needs trilyte 

## 2015-06-09 NOTE — Telephone Encounter (Signed)
TCS sch'd 07/27/15, patient aware

## 2015-06-09 NOTE — Telephone Encounter (Signed)
Results given to patient. I discussed with Dr. Laural Golden. Needs a colonoscopy.  Gloria Alvarado, colonoscopy

## 2015-06-14 MED ORDER — PEG 3350-KCL-NA BICARB-NACL 420 G PO SOLR: 4000.0000 mL | Freq: Once | ORAL | Status: DC

## 2015-07-27 ENCOUNTER — Encounter (HOSPITAL_COMMUNITY): Payer: Self-pay | Admitting: *Deleted

## 2015-07-27 ENCOUNTER — Encounter (HOSPITAL_COMMUNITY)
Admission: RE | Disposition: A | Discharge status: Home / Self Care | Payer: Self-pay | Source: Ambulatory Visit | Attending: Internal Medicine

## 2015-07-27 ENCOUNTER — Ambulatory Visit (HOSPITAL_COMMUNITY)
Admission: RE | Admit: 2015-07-27 | Discharge: 2015-07-27 | Disposition: A | Discharge status: Home / Self Care | Payer: 59 | Source: Ambulatory Visit | Attending: Internal Medicine | Admitting: Internal Medicine

## 2015-07-27 DIAGNOSIS — Z809 Family history of malignant neoplasm, unspecified: Secondary | ICD-10-CM | POA: Diagnosis not present

## 2015-07-27 DIAGNOSIS — F329 Major depressive disorder, single episode, unspecified: Secondary | ICD-10-CM | POA: Insufficient documentation

## 2015-07-27 DIAGNOSIS — R195 Other fecal abnormalities: Secondary | ICD-10-CM

## 2015-07-27 DIAGNOSIS — Z79899 Other long term (current) drug therapy: Secondary | ICD-10-CM | POA: Diagnosis not present

## 2015-07-27 DIAGNOSIS — Z8042 Family history of malignant neoplasm of prostate: Secondary | ICD-10-CM | POA: Insufficient documentation

## 2015-07-27 DIAGNOSIS — Z87891 Personal history of nicotine dependence: Secondary | ICD-10-CM | POA: Insufficient documentation

## 2015-07-27 DIAGNOSIS — Z882 Allergy status to sulfonamides status: Secondary | ICD-10-CM | POA: Diagnosis not present

## 2015-07-27 DIAGNOSIS — M549 Dorsalgia, unspecified: Secondary | ICD-10-CM | POA: Insufficient documentation

## 2015-07-27 DIAGNOSIS — K644 Residual hemorrhoidal skin tags: Secondary | ICD-10-CM

## 2015-07-27 DIAGNOSIS — Z8349 Family history of other endocrine, nutritional and metabolic diseases: Secondary | ICD-10-CM | POA: Diagnosis not present

## 2015-07-27 DIAGNOSIS — K602 Anal fissure, unspecified: Secondary | ICD-10-CM | POA: Diagnosis not present

## 2015-07-27 DIAGNOSIS — K6289 Other specified diseases of anus and rectum: Secondary | ICD-10-CM | POA: Diagnosis not present

## 2015-07-27 DIAGNOSIS — Z8249 Family history of ischemic heart disease and other diseases of the circulatory system: Secondary | ICD-10-CM | POA: Insufficient documentation

## 2015-07-27 DIAGNOSIS — K921 Melena: Secondary | ICD-10-CM | POA: Diagnosis not present

## 2015-07-27 HISTORY — PX: COLONOSCOPY: SHX5424

## 2015-07-27 SURGERY — COLONOSCOPY
Anesthesia: Moderate Sedation

## 2015-07-27 MED ORDER — MEPERIDINE HCL 50 MG/ML IJ SOLN
INTRAMUSCULAR | Status: DC | PRN
  Administered 2015-07-27 (×2): 25 mg via INTRAVENOUS

## 2015-07-27 MED ORDER — PSYLLIUM 28 % PO PACK: 1.0000 | PACK | Freq: Every day | ORAL | Status: DC

## 2015-07-27 MED ORDER — MEPERIDINE HCL 50 MG/ML IJ SOLN
INTRAMUSCULAR | Status: AC
  Filled 2015-07-27: qty 1

## 2015-07-27 MED ORDER — STERILE WATER FOR IRRIGATION IR SOLN
Status: DC | PRN
  Administered 2015-07-27: 350 mL

## 2015-07-27 MED ORDER — DILTIAZEM GEL 2 %: 1.0000 "application " | Freq: Two times a day (BID) | CUTANEOUS | Status: DC

## 2015-07-27 MED ORDER — SODIUM CHLORIDE 0.9 % IV SOLN
INTRAVENOUS | Status: DC
  Administered 2015-07-27: 1000 mL via INTRAVENOUS

## 2015-07-27 MED ORDER — MIDAZOLAM HCL 5 MG/5ML IJ SOLN
INTRAMUSCULAR | Status: DC | PRN
  Administered 2015-07-27 (×3): 2 mg via INTRAVENOUS
  Administered 2015-07-27: 1 mg via INTRAVENOUS
  Administered 2015-07-27: 2 mg via INTRAVENOUS
  Administered 2015-07-27: 3 mg via INTRAVENOUS

## 2015-07-27 MED ORDER — MIDAZOLAM HCL 5 MG/5ML IJ SOLN
INTRAMUSCULAR | Status: AC
  Filled 2015-07-27: qty 10

## 2015-07-27 MED ORDER — MIDAZOLAM HCL 5 MG/5ML IJ SOLN
INTRAMUSCULAR | Status: AC
  Filled 2015-07-27: qty 5

## 2015-07-27 NOTE — Op Note (Signed)
[facility] Patient Name: Gloria Alvarado Procedure Date: 07/27/2015 12:52 PM MRN: [id number] Date of Birth: 1985-11-23 Attending MD: Hildred Laser , MD CSN: [id number] Age: 30 Admit Type: Outpatient Procedure:                Colonoscopy Indications:              Hematochezia, Heme positive stool Providers:                Hildred Laser, MD, Lurline Del, RN, Randa Spike,                            Technician Referring MD:             Derrek Monaco NP, NP Medicines:                Meperidine 50 mg IV, Midazolam 12 mg IV Complications:            No immediate complications. Estimated Blood Loss:     Estimated blood loss: none. Procedure:                Pre-Anesthesia Assessment:                           - Prior to the procedure, a History and Physical                            was performed, and patient medications and                            allergies were reviewed. The patient's tolerance of                            previous anesthesia was also reviewed. The risks                            and benefits of the procedure and the sedation                            options and risks were discussed with the patient.                            All questions were answered, and informed consent                            was obtained. Prior Anticoagulants: The patient has                            taken ibuprofen, last dose was 7 days prior to                            procedure. ASA Grade Assessment: I - A normal,                            healthy patient. After reviewing the risks and  benefits, the patient was deemed in satisfactory                            condition to undergo the procedure.                           After obtaining informed consent, the colonoscope                            was passed under direct vision. Throughout the                            procedure, the patient's blood pressure, pulse, and    oxygen saturations were monitored continuously. The                            [id number]) was introduced through the                            anus and advanced to the the terminal ileum. The                            colonoscopy was performed without difficulty. The                            patient tolerated the procedure well. The quality                            of the bowel preparation was adequate. The terminal                            ileum, ileocecal valve, appendiceal orifice, and                            rectum were photographed. Scope In: 1:18:15 PM Scope Out: 1:42:41 PM Scope Withdrawal Time: 0 hours 10 minutes 51 seconds  Total Procedure Duration: 0 hours 24 minutes 26 seconds  Findings:      The terminal ileum appeared normal.      The colon (entire examined portion) appeared normal.      A small anal fissure was found in the anal canal.      External hemorrhoids were found during retroflexion. The hemorrhoids       were small.      Anal papilla(e) were hypertrophied. Impression:               - The examined portion of the ileum was normal.                           - The entire examined colon is normal.                           - Anal fissure.                           - External hemorrhoids.                           -  Small Anal papillae.                           - No specimens collected. Moderate Sedation:      Moderate (conscious) sedation was administered by the endoscopy nurse       and supervised by the endoscopist. The following parameters were       monitored: oxygen saturation, heart rate, blood pressure, CO2       capnography and response to care. Total physician intraservice time was       34 minutes. Recommendation:           - Patient has a contact number available for                            emergencies. The signs and symptoms of potential                            delayed complications were discussed with the                             patient. Return to normal activities tomorrow.                            Written discharge instructions were provided to the                            patient.                           - Resume previous diet today.                           - Continue present medications.                           - diltiazem gel application twice a day to anal                            canal.                           - Return to GI office in 1 month.                           - Use sugar-free Metamucil one teaspoon PO daily.                           - No recommendation at this time regarding repeat                            colonoscopy due to young age. Procedure Code(s):        --- Professional ---                           [phone], Colonoscopy, flexible; diagnostic, including  collection of specimen(s) by brushing or washing,                            when performed (separate procedure)                           99152, Moderate sedation services provided by the                            same physician or other qualified health care                            professional performing the diagnostic or                            therapeutic service that the sedation supports,                            requiring the presence of an independent trained                            observer to assist in the monitoring of the                            patient's level of consciousness and physiological                            status; initial 15 minutes of intraservice time,                            patient age 24 years or older                           [phone], Moderate sedation services; each additional                            15 minutes intraservice time Diagnosis Code(s):        --- Professional ---                           K64.4, Residual hemorrhoidal skin tags                           K60.2, Anal fissure, unspecified                           K62.89, Other  specified diseases of anus and rectum                           K92.1, Melena (includes Hematochezia)                           R19.5, Other fecal abnormalities CPT copyright 2016 American Medical Association. All rights reserved. The codes documented in this report are preliminary and upon coder review may  be revised to meet current compliance requirements.  Hildred Laser, MD Hildred Laser, MD 07/27/2015 1:58:46 PM This report has been signed electronically. Number of Addenda: 0

## 2015-07-27 NOTE — H&P (Signed)
Gloria Alvarado is an 30 y.o. female.   Chief Complaint: Patient is here for colonoscopy. HPI: Patient is 30 year old Caucasian female who presents with 3-4 month history of rectal bleeding which is intermittent. She usually passes small amount of blood in the bowel movements and sometimes without bowel movement. She had 2 episodes where she passed moderate amount of blood. She has noted intermittent pain in right low quadrant as well as left upper quadrant. She denies diarrhea and/or constipation. Family history is negative for IBD. She takes Advil 40 mg no more than once or twice a week.  Past Medical History  Diagnosis Date  . Contraceptive management 05/03/2013  . Dizzy spells 04/17/2015  . Nausea 04/17/2015  . Abdominal pain 04/17/2015  . Shortness of breath 04/17/2015  . Gas 04/17/2015  . Depression 04/17/2015  . Hemangioma   . Back pain 06/05/2015    Past Surgical History  Procedure Laterality Date  . Wisdom tooth extraction      Family History  Problem Relation Age of Onset  . Hypertension Mother   . Hyperlipidemia Mother   . Cancer Maternal Grandfather   . Hypertension Sister   . Thyroid disease Maternal Grandmother   . Prostate cancer Father    Social History:  reports that she quit smoking about 10 years ago. Her smoking use included Cigarettes. She started smoking about 15 years ago. She smoked 0.25 packs per day. She has never used smokeless tobacco. She reports that she drinks alcohol. She reports that she does not use illicit drugs.  Allergies:  Allergies  Allergen Reactions  . Sulfa Antibiotics Shortness Of Breath, Swelling and Other (See Comments)    Throat swelling, shortness of breath.    Medications Prior to Admission  Medication Sig Dispense Refill  . cyclobenzaprine (FLEXERIL) 10 MG tablet Take 1 tablet (10 mg total) by mouth every 8 (eight) hours as needed for muscle spasms. 30 tablet 1  . escitalopram (LEXAPRO) 10 MG tablet Take 1 tablet (10 mg total) by mouth  daily. 30 tablet 6  . GuaiFENesin (MUCINEX PO) Take by mouth as needed.    Marland Kitchen ibuprofen (ADVIL,MOTRIN) 200 MG tablet Take 400 mg by mouth as needed.    . loratadine (CLARITIN) 10 MG tablet Take 10 mg by mouth daily.    . Omeprazole (PRILOSEC PO) Take by mouth daily.    . Pediatric Multiple Vit-C-FA (FLINSTONES GUMMIES OMEGA-3 DHA PO) Take by mouth. Takes 2 daily    . polyethylene glycol-electrolytes (NULYTELY/GOLYTELY) 420 g solution Take 4,000 mLs by mouth once. 4000 mL 0  . Probiotic Product (PROBIOTIC PO) Take by mouth daily.    . TRI-SPRINTEC 0.18/0.215/0.25 MG-35 MCG tablet TAKE ONE TABLET BY MOUTH ONCE DAILY 28 tablet 11    No results found for this or any previous visit (from the past 48 hour(s)). No results found.  ROS  Blood pressure 107/69, pulse 74, temperature 97.5 F (36.4 C), temperature source Oral, resp. rate 19, height 5\' 5"  (1.651 m), weight 180 lb (81.647 kg), last menstrual period 07/23/2015, SpO2 99 %. Physical Exam  Constitutional: She appears well-developed and well-nourished.  HENT:  Mouth/Throat: Oropharynx is clear and moist.  Eyes: Conjunctivae are normal. No scleral icterus.  Neck: No thyromegaly present.  Cardiovascular: Normal rate, regular rhythm and normal heart sounds.   No murmur heard. Respiratory: Effort normal and breath sounds normal.  GI: Soft. She exhibits no distension and no mass. There is tenderness (mild midepigastric tenderness.).  Musculoskeletal: She exhibits no edema.  Lymphadenopathy:    She has no cervical adenopathy.  Neurological: She is alert.  Skin: Skin is warm and dry.     Assessment/Plan Rectal bleeding. Diagnostic colonoscopy.  Hildred Laser, MD 07/27/2015, 1:00 PM

## 2015-07-27 NOTE — Discharge Instructions (Signed)
Resume usual medications and diet. Diltiazem gel to be applied to anal canals twice daily for 1 month. Metamucil 3-4 g by mouth daily at bedtime(or half a packet). No driving for 24 hours. Keep symptom diary as to frequency of bleeding episodes. Office visit in one month.  Colonoscopy, Care After These instructions give you information on caring for yourself after your procedure. Your doctor may also give you more specific instructions. Call your doctor if you have any problems or questions after your procedure. HOME CARE  Do not drive for 24 hours.  Do not sign important papers or use machinery for 24 hours.  You may shower.  You may go back to your usual activities, but go slower for the first 24 hours.  Take rest breaks often during the first 24 hours.  Walk around or use warm packs on your belly (abdomen) if you have belly cramping or gas.  Drink enough fluids to keep your pee (urine) clear or pale yellow.  Resume your normal diet. Avoid heavy or fried foods.  Avoid drinking alcohol for 24 hours or as told by your doctor.  Only take medicines as told by your doctor. If a tissue sample (biopsy) was taken during the procedure:   Do not take aspirin or blood thinners for 7 days, or as told by your doctor.  Do not drink alcohol for 7 days, or as told by your doctor.  Eat soft foods for the first 24 hours. GET HELP IF: You still have a small amount of blood in your poop (stool) 2-3 days after the procedure. GET HELP RIGHT AWAY IF:  You have more than a small amount of blood in your poop.  You see clumps of tissue (blood clots) in your poop.  Your belly is puffy (swollen).  You feel sick to your stomach (nauseous) or throw up (vomit).  You have a fever.  You have belly pain that gets worse and medicine does not help. MAKE SURE YOU:  Understand these instructions.  Will watch your condition.  Will get help right away if you are not doing well or get worse.     This information is not intended to replace advice given to you by your health care provider. Make sure you discuss any questions you have with your health care provider.   Document Released: 02/02/2010 Document Revised: 01/05/2013 Document Reviewed: 09/07/2012 Elsevier Interactive Patient Education 2016 [place] Fissure, Adult An anal fissure is a small tear or crack in the skin around the anus. Bleeding from a fissure usually stops on its own within a few minutes. However, bleeding will often occur again with each bowel movement until the crack heals. CAUSES This condition may be caused by:  Passing large, hard stool (feces).  Frequent diarrhea.  Constipation.  Inflammatory bowel disease (Crohn disease or ulcerative colitis).  Infections.  Anal sex. SYMPTOMS Symptoms of this condition include:  Bleeding from the rectum.  Small amounts of blood seen on your stool, on toilet paper, or in the toilet after a bowel movement.  Painful bowel movements.  Itching or irritation around the anus. DIAGNOSIS A health care provider may diagnose this condition by closely examining the anal area. An anal fissure can usually be seen with careful inspection. In some cases, a rectal exam may be performed, or a short tube (anoscope) may be used to examine the anal canal. TREATMENT Treatment for this condition may include:  Taking steps to avoid constipation. This may include making  changes to your diet, such as increasing your intake of fiber or fluid.  Taking fiber supplements. These supplements can soften your stool to help make bowel movements easier. Your health care provider may also prescribe a stool softener if your stool is often hard.  Taking sitz baths. This may help to heal the tear.  Using medicated creams or ointments. These may be prescribed to lessen discomfort. HOME CARE INSTRUCTIONS Eating and Drinking  Avoid foods that may be constipating, such as bananas  and dairy products.  Drink enough fluid to keep your urine clear or pale yellow.  Maintain a diet that is high in fruits, whole grains, and vegetables. General Instructions  Keep the anal area as clean and dry as possible.  Take sitz baths as told by your health care provider. Do not use soap in the sitz baths.  Take over-the-counter and prescription medicines only as told by your health care provider.  Use creams or ointments only as told by your health care provider.  Keep all follow-up visits as told by your health care provider. This is important. SEEK MEDICAL CARE IF:  You have more bleeding.  You have a fever.  You have diarrhea that is mixed with blood.  You continue to have pain.  Your problem is getting worse rather than better.   This information is not intended to replace advice given to you by your health care provider. Make sure you discuss any questions you have with your health care provider.   Document Released: 12/31/2004 Document Revised: 09/21/2014 Document Reviewed: 03/28/2014 Elsevier Interactive Patient Education [employer].

## 2015-07-31 ENCOUNTER — Encounter (HOSPITAL_COMMUNITY): Payer: Self-pay | Admitting: Internal Medicine

## 2015-07-31 ENCOUNTER — Telehealth: Payer: Self-pay | Admitting: Adult Health

## 2015-07-31 DIAGNOSIS — M545 Low back pain, unspecified: Secondary | ICD-10-CM

## 2015-07-31 NOTE — Telephone Encounter (Signed)
Pt called stating Dr. Laural Golden recommended she have a MRI of her lower back d/t Lt lower back pain. Pt reported same pain at 06/05/15 visit w/ [facility]. [facility] is out of town this week. Order placed in Epic, and scheduled for 7/31 @ 2pm @ [facility], be there at 1:30pm.  Roma Schanz, CNM, [facility] 07/31/2015 3:36 PM

## 2015-07-31 NOTE — Telephone Encounter (Signed)
Pt informed MRI scheduled at [facility] 07/31 @ 2 pm, be there at 130. Pt verbalized understanding.

## 2015-08-14 ENCOUNTER — Other Ambulatory Visit: Payer: Self-pay | Admitting: Women's Health

## 2015-08-14 ENCOUNTER — Ambulatory Visit (HOSPITAL_COMMUNITY)
Admission: RE | Admit: 2015-08-14 | Discharge: 2015-08-14 | Disposition: A | Discharge status: Home / Self Care | Payer: 59 | Source: Ambulatory Visit | Attending: Women's Health | Admitting: Women's Health

## 2015-08-14 DIAGNOSIS — D1809 Hemangioma of other sites: Secondary | ICD-10-CM | POA: Diagnosis not present

## 2015-08-14 DIAGNOSIS — M545 Low back pain, unspecified: Secondary | ICD-10-CM

## 2015-08-15 ENCOUNTER — Other Ambulatory Visit: Payer: Self-pay | Admitting: Women's Health

## 2015-08-15 ENCOUNTER — Telehealth: Payer: Self-pay | Admitting: Women's Health

## 2015-08-15 NOTE — Telephone Encounter (Signed)
LM for pt to return call. Need to discuss lumbar spine MRI results, see if she has a preference for neurosurgeon referral.  Roma Schanz, CNM, [facility]NP-BC 08/15/2015 2:12 PM

## 2015-08-23 ENCOUNTER — Telehealth: Payer: Self-pay | Admitting: Women's Health

## 2015-08-24 NOTE — Telephone Encounter (Signed)
Pt informed appt scheduled with Dr. Ellene Route, [place], 10/11/2015 @ 2:30 pm, arrival time 2 pm. Pt verbalized understanding.

## 2015-08-29 ENCOUNTER — Ambulatory Visit (INDEPENDENT_AMBULATORY_CARE_PROVIDER_SITE_OTHER): Payer: 59 | Admitting: Internal Medicine

## 2015-09-04 ENCOUNTER — Encounter: Payer: Self-pay | Admitting: Adult Health

## 2015-09-04 ENCOUNTER — Ambulatory Visit (INDEPENDENT_AMBULATORY_CARE_PROVIDER_SITE_OTHER): Payer: 59 | Admitting: Adult Health

## 2015-09-04 ENCOUNTER — Ambulatory Visit (INDEPENDENT_AMBULATORY_CARE_PROVIDER_SITE_OTHER): Payer: 59 | Admitting: Internal Medicine

## 2015-09-04 ENCOUNTER — Encounter (INDEPENDENT_AMBULATORY_CARE_PROVIDER_SITE_OTHER): Payer: Self-pay | Admitting: Internal Medicine

## 2015-09-04 VITALS — BP 120/72 | HR 76 | Ht 65.0 in | Wt 193.0 lb

## 2015-09-04 VITALS — BP 180/80 | HR 64 | Temp 97.9°F | Ht 65.0 in | Wt 192.4 lb

## 2015-09-04 DIAGNOSIS — T148 Other injury of unspecified body region: Secondary | ICD-10-CM

## 2015-09-04 DIAGNOSIS — K602 Anal fissure, unspecified: Secondary | ICD-10-CM

## 2015-09-04 DIAGNOSIS — R6882 Decreased libido: Secondary | ICD-10-CM | POA: Diagnosis not present

## 2015-09-04 DIAGNOSIS — F329 Major depressive disorder, single episode, unspecified: Secondary | ICD-10-CM | POA: Diagnosis not present

## 2015-09-04 DIAGNOSIS — K625 Hemorrhage of anus and rectum: Secondary | ICD-10-CM

## 2015-09-04 DIAGNOSIS — F32A Depression, unspecified: Secondary | ICD-10-CM

## 2015-09-04 DIAGNOSIS — W57XXXA Bitten or stung by nonvenomous insect and other nonvenomous arthropods, initial encounter: Secondary | ICD-10-CM

## 2015-09-04 MED ORDER — BUPROPION HCL ER (SR) 150 MG PO TB12: 150.0000 mg | ORAL_TABLET | Freq: Every day | ORAL | 6 refills | Status: DC

## 2015-09-04 MED ORDER — FLUOCINONIDE 0.05 % EX CREA: 1.0000 "application " | TOPICAL_CREAM | Freq: Two times a day (BID) | CUTANEOUS | 0 refills | Status: DC

## 2015-09-04 NOTE — Progress Notes (Signed)
Subjective:    Patient ID: Gloria Alvarado, female    DOB: [birth year], 30 y.o.   MRN: [id number]  HPI Here today for f/u after recent colonoscopy in July for rectal bleeding.  Noted to have an anal fissure. Rx for diltiazem gel BID sent to her pharmacy by Dr. Laural Golden She tells me the bleeding is better. She still has some pain in her epigastric region. She tried stopping the Prilosec but started back. The nausea resolved.   She is having a BM every day. Stools have a little more gooey, but not liquidity. Her appetite is good. No weight loss.  No rectal pain.     07/27/2015 Colonoscopy: rectal bleeding: Dr. Laural Golden:  Impression:               - The examined portion of the ileum was normal.                           - The entire examined colon is normal.                           - Anal fissure.                           - External hemorrhoids.                           - Small Anal papillae.                           - No specimens collected.    Review of Systems Past Medical History:  Diagnosis Date  . Abdominal pain 04/17/2015  . Back pain 06/05/2015  . Contraceptive management 05/03/2013  . Depression 04/17/2015  . Dizzy spells 04/17/2015  . Gas 04/17/2015  . Hemangioma   . Nausea 04/17/2015  . Shortness of breath 04/17/2015    Past Surgical History:  Procedure Laterality Date  . COLONOSCOPY N/A 07/27/2015   Procedure: COLONOSCOPY;  Surgeon: Rogene Houston, MD;  Location: [facility] ENDO [facility]ITE;  Service: Endoscopy;  Laterality: N/A;  225 - moved to 1:30 - Ann notified pt  . WISDOM TOOTH EXTRACTION      Allergies  Allergen Reactions  . Sulfa Antibiotics Shortness Of Breath, Swelling and Other (See Comments)    Throat swelling, shortness of breath.    Current Outpatient Prescriptions on File Prior to Visit  Medication Sig Dispense Refill  . cyclobenzaprine (FLEXERIL) 10 MG tablet Take 1 tablet (10 mg total) by mouth every 8 (eight) hours as needed for muscle spasms. 30 tablet 1  .  diltiazem 2 % GEL Apply 1 application topically 2 (two) times daily. 30 g 2  . escitalopram (LEXAPRO) 10 MG tablet Take 1 tablet (10 mg total) by mouth daily. 30 tablet 6  . GuaiFENesin (MUCINEX PO) Take by mouth as needed.    Marland Kitchen ibuprofen (ADVIL,MOTRIN) 200 MG tablet Take 400 mg by mouth as needed.    . loratadine (CLARITIN) 10 MG tablet Take 10 mg by mouth daily.    . Omeprazole (PRILOSEC PO) Take by mouth daily.    . Pediatric Multiple Vit-C-FA (FLINSTONES GUMMIES OMEGA-3 DHA PO) Take by mouth. Takes 2 daily    . Probiotic Product (PROBIOTIC PO) Take by mouth daily.    . psyllium (METAMUCIL SMOOTH  TEXTURE) 28 % packet Take 1 packet by mouth at bedtime.    . TRI-SPRINTEC 0.18/0.215/0.25 MG-35 MCG tablet TAKE ONE TABLET BY MOUTH ONCE DAILY 28 tablet 11   No current facility-administered medications on file prior to visit.        Objective:   Physical Exam Blood pressure (!) 180/80, pulse 64, temperature 97.9 F (36.6 C), height 5\' 5"  (1.651 m), weight 192 lb 6.4 oz (87.3 kg).  Alert and oriented. Skin warm and dry. Oral mucosa is moist.   . Sclera anicteric, conjunctivae is pink. Thyroid not enlarged. No cervical lymphadenopathy. Lungs clear. Heart regular rate and rhythm.  Abdomen is soft. Bowel sounds are positive. No hepatomegaly. No abdominal masses felt. No tenderness.  No edema to lower extremities.  .       Assessment & Plan:  Anal fissure. No pain at this time. If pain reoccurs: start Diltiazem cream. Continue the Metamucil. GERD: continue the Prilosec. GERD diet given to patient OV in 1 year.

## 2015-09-04 NOTE — Progress Notes (Signed)
Subjective:     Patient ID: Gloria Alvarado, female   DOB: 1985/10/31, 30 y.o.   MRN: [id number]  HPI Gloria Alvarado is a 30 year old white female in for follow up of starting lexapro and is fine except has no sex drive and can't achieve orgasm on it, she says has never had big libido anyway.She is also complaining of bug bites, itches and bruised.She had follow up at Dr Olevia Perches this morning for history of rectal bleeding had tear and hemorrhoids, she said, and has appt with neurosurgeon in September for back pain.  Review of Systems  +itching areas?bug bites.She denies any fever or feeling sick  Decreased libido with lexapro  Reviewed past medical,surgical, social and family history. Reviewed medications and allergies.     Objective:   Physical Exam BP 120/72 (BP Location: Left Arm, Patient Position: Sitting, Cuff Size: Normal)   Pulse 76   Ht 5\' 5"  (1.651 m)   Wt 193 lb (87.5 kg)   BMI 32.12 kg/m    Skin warm and dry, and has 6-8 red areas right posterior thigh since yesterday and 3 have bruising around them. Dr Elonda Husky for co exam.Will treat topically with steroid cream.  Will stop lexapro and try Wellburtin.  Assessment:       Depression  Bug bites     Plan:     Stop lexapro Rx wellbutrin 150 mg SR #30 take 1 daily with 6 refills Rx lidex 0.05% cream bid Follow up in 1 week for recheck of bites

## 2015-09-04 NOTE — Patient Instructions (Signed)
Follow up in 1 week

## 2015-09-04 NOTE — Patient Instructions (Signed)
OV 1 year. 

## 2015-09-11 ENCOUNTER — Ambulatory Visit: Payer: 59 | Admitting: Adult Health

## 2015-10-17 ENCOUNTER — Ambulatory Visit (INDEPENDENT_AMBULATORY_CARE_PROVIDER_SITE_OTHER): Payer: 59 | Admitting: Internal Medicine

## 2016-01-01 ENCOUNTER — Other Ambulatory Visit: Payer: Self-pay | Admitting: *Deleted

## 2016-01-01 MED ORDER — NORGESTIM-ETH ESTRAD TRIPHASIC 0.18/0.215/0.25 MG-35 MCG PO TABS: 1.0000 | ORAL_TABLET | Freq: Every day | ORAL | 11 refills | Status: DC

## 2016-04-02 ENCOUNTER — Other Ambulatory Visit: Payer: Self-pay | Admitting: Adult Health

## 2016-04-11 ENCOUNTER — Telehealth: Payer: Self-pay | Admitting: *Deleted

## 2016-04-11 NOTE — Telephone Encounter (Signed)
Pt c/o UTI symptoms and wanted antibiotics called in to her pharmacy. Advised patient that we would need to see her before prescribing antibiotics. Encouraged patient to push fluids, try cranberry juice, and to go to urgent care if symptoms worsen or dont improve over the weekend.  Pt verbalized understanding and stated she would call for appt Monday morning if no improvement.

## 2016-05-13 ENCOUNTER — Encounter: Payer: Self-pay | Admitting: Adult Health

## 2016-06-20 ENCOUNTER — Encounter: Payer: Self-pay | Admitting: Adult Health

## 2016-06-24 ENCOUNTER — Ambulatory Visit (INDEPENDENT_AMBULATORY_CARE_PROVIDER_SITE_OTHER): Payer: 59 | Admitting: Adult Health

## 2016-06-24 ENCOUNTER — Encounter: Payer: Self-pay | Admitting: Adult Health

## 2016-06-24 VITALS — BP 118/80 | HR 74 | Ht 65.0 in | Wt 179.5 lb

## 2016-06-24 DIAGNOSIS — Z01419 Encounter for gynecological examination (general) (routine) without abnormal findings: Secondary | ICD-10-CM | POA: Diagnosis not present

## 2016-06-24 DIAGNOSIS — F489 Nonpsychotic mental disorder, unspecified: Secondary | ICD-10-CM | POA: Diagnosis not present

## 2016-06-24 DIAGNOSIS — R4589 Other symptoms and signs involving emotional state: Secondary | ICD-10-CM

## 2016-06-24 DIAGNOSIS — D1803 Hemangioma of intra-abdominal structures: Secondary | ICD-10-CM

## 2016-06-24 DIAGNOSIS — Z3041 Encounter for surveillance of contraceptive pills: Secondary | ICD-10-CM

## 2016-06-24 MED ORDER — BUPROPION HCL ER (SR) 150 MG PO TB12: 150.0000 mg | ORAL_TABLET | Freq: Two times a day (BID) | ORAL | 6 refills | Status: DC

## 2016-06-24 MED ORDER — NORETHIN ACE-ETH ESTRAD-FE 1-20 MG-MCG PO TABS: 1.0000 | ORAL_TABLET | Freq: Every day | ORAL | 11 refills | Status: DC

## 2016-06-24 NOTE — Progress Notes (Signed)
Patient ID: Gloria Alvarado, female   DOB: 13-Dec-1985, 31 y.o.   MRN: [id number] History of Present Illness:  Gloria Alvarado is a 30 year old white female, married, G0P0, in for well woman gyn exam, she had normal pap 05/23/14.   Current Medications, Allergies, Past Medical History, Past Surgical History, Family History and Social History were reviewed in [employer] record.     Review of Systems: Patient denies any headaches, hearing loss, fatigue, blurred vision, shortness of breath, chest pain, abdominal pain, problems with bowel movements, urination, or intercourse. No joint pain, she is moody and teary at times and skin is dry then oily.She is thinking of stopping OCs in Fall to try to get pregnant.     Physical Exam:BP 118/80 (BP Location: Left Arm, Patient Position: Sitting, Cuff Size: Normal)   Pulse 74   Ht 5\' 5"  (1.651 m)   Wt 179 lb 8 oz (81.4 kg)   LMP 06/23/2016 (Exact Date)   BMI 29.87 kg/m  General:  Well developed, well nourished, no acute distress Skin:  Warm and dry Neck:  Midline trachea, normal thyroid, good ROM, no lymphadenopathy Lungs; Clear to auscultation bilaterally Breast:  No dominant palpable mass, retraction, or nipple discharge Cardiovascular: Regular rate and rhythm Abdomen:  Soft, non tender, no hepatosplenomegaly Pelvic:  External genitalia is normal in appearance, no lesions.  The vagina is normal in appearance. Urethra has no lesions or masses. The cervix is smooth.  Uterus is felt to be normal size, shape, and contour.  No adnexal masses or tenderness noted.Bladder is non tender, no masses felt. Extremities/musculoskeletal:  No swelling or varicosities noted, no clubbing or cyanosis Psych: alert and cooperative,seems happy,but is moody at times PHQ 2 score 0.  Impression: 1. Well woman exam with routine gynecological exam   2. Encounter for surveillance of contraceptive pills   3. Moody   4. Hemangioma of liver       Plan:  Rx  junel 1/20 take 1 daily disp 1 pack with 11 refills, use condoms Will increase Wellbutrin to bid,Rx Wellbutrin SR 150 mg take 1 bid #60 with 6 refills Take PNV with folic acid Get abdominal US 7/2 at [facility] at 8:30 am to recheck liver hemangioma stability  Pap and physical in 1 year

## 2016-07-15 ENCOUNTER — Telehealth: Payer: Self-pay | Admitting: Adult Health

## 2016-07-15 ENCOUNTER — Ambulatory Visit (HOSPITAL_COMMUNITY)
Admission: RE | Admit: 2016-07-15 | Discharge: 2016-07-15 | Disposition: A | Discharge status: Home / Self Care | Payer: 59 | Source: Ambulatory Visit | Attending: Adult Health | Admitting: Adult Health

## 2016-07-15 DIAGNOSIS — D1803 Hemangioma of intra-abdominal structures: Secondary | ICD-10-CM | POA: Insufficient documentation

## 2016-07-15 NOTE — Telephone Encounter (Signed)
Left message that hemangiomas are stable on [place]

## 2016-07-16 ENCOUNTER — Encounter: Payer: Self-pay | Admitting: Adult Health

## 2016-08-16 ENCOUNTER — Encounter (INDEPENDENT_AMBULATORY_CARE_PROVIDER_SITE_OTHER): Payer: Self-pay | Admitting: Internal Medicine

## 2016-09-04 ENCOUNTER — Ambulatory Visit (INDEPENDENT_AMBULATORY_CARE_PROVIDER_SITE_OTHER): Payer: 59 | Admitting: Internal Medicine

## 2016-10-16 ENCOUNTER — Encounter: Payer: Self-pay | Admitting: Adult Health

## 2016-11-12 ENCOUNTER — Encounter: Payer: Self-pay | Admitting: Adult Health

## 2016-11-14 ENCOUNTER — Encounter: Payer: Self-pay | Admitting: Adult Health

## 2016-11-18 ENCOUNTER — Encounter: Payer: Self-pay | Admitting: Adult Health

## 2016-11-18 ENCOUNTER — Ambulatory Visit (INDEPENDENT_AMBULATORY_CARE_PROVIDER_SITE_OTHER): Payer: 59 | Admitting: Adult Health

## 2016-11-18 VITALS — BP 104/60 | HR 94 | Resp 18 | Ht 65.0 in | Wt 166.0 lb

## 2016-11-18 DIAGNOSIS — N926 Irregular menstruation, unspecified: Secondary | ICD-10-CM

## 2016-11-18 DIAGNOSIS — Z3201 Encounter for pregnancy test, result positive: Secondary | ICD-10-CM

## 2016-11-18 DIAGNOSIS — Z3A01 Less than 8 weeks gestation of pregnancy: Secondary | ICD-10-CM

## 2016-11-18 DIAGNOSIS — O3680X0 Pregnancy with inconclusive fetal viability, not applicable or unspecified: Secondary | ICD-10-CM

## 2016-11-18 LAB — POCT URINE PREGNANCY: PREG TEST UR: POSITIVE — AB

## 2016-11-18 NOTE — Patient Instructions (Signed)
First Trimester of Pregnancy The first trimester of pregnancy is from week 1 until the end of week 13 (months 1 through 3). A week after a sperm fertilizes an egg, the egg will implant on the wall of the uterus. This embryo will begin to develop into a baby. Genes from you and your partner will form the baby. The female genes will determine whether the baby will be a boy or a girl. At 6-8 weeks, the eyes and face will be formed, and the heartbeat can be seen on ultrasound. At the end of 12 weeks, all the baby's organs will be formed. Now that you are pregnant, you will want to do everything you can to have a healthy baby. Two of the most important things are to get good prenatal care and to follow your health care provider's instructions. Prenatal care is all the medical care you receive before the baby's birth. This care will help prevent, find, and treat any problems during the pregnancy and childbirth. Body changes during your first trimester Your body goes through many changes during pregnancy. The changes vary from woman to woman.  You may gain or lose a couple of pounds at first.  You may feel sick to your stomach (nauseous) and you may throw up (vomit). If the vomiting is uncontrollable, call your health care provider.  You may tire easily.  You may develop headaches that can be relieved by medicines. All medicines should be approved by your health care provider.  You may urinate more often. Painful urination may mean you have a bladder infection.  You may develop heartburn as a result of your pregnancy.  You may develop constipation because certain hormones are causing the muscles that push stool through your intestines to slow down.  You may develop hemorrhoids or swollen veins (varicose veins).  Your breasts may begin to grow larger and become tender. Your nipples may stick out more, and the tissue that surrounds them (areola) may become darker.  Your gums may bleed and may be  sensitive to brushing and flossing.  Dark spots or blotches (chloasma, mask of pregnancy) may develop on your face. This will likely fade after the baby is born.  Your menstrual periods will stop.  You may have a loss of appetite.  You may develop cravings for certain kinds of food.  You may have changes in your emotions from day to day, such as being excited to be pregnant or being concerned that something may go wrong with the pregnancy and baby.  You may have more vivid and strange dreams.  You may have changes in your hair. These can include thickening of your hair, rapid growth, and changes in texture. Some women also have hair loss during or after pregnancy, or hair that feels dry or thin. Your hair will most likely return to normal after your baby is born.  What to expect at prenatal visits During a routine prenatal visit:  You will be weighed to make sure you and the baby are growing normally.  Your blood pressure will be taken.  Your abdomen will be measured to track your baby's growth.  The fetal heartbeat will be listened to between weeks 10 and 14 of your pregnancy.  Test results from any previous visits will be discussed.  Your health care provider may ask you:  How you are feeling.  If you are feeling the baby move.  If you have had any abnormal symptoms, such as leaking fluid, bleeding, severe headaches,   or abdominal cramping.  If you are using any tobacco products, including cigarettes, chewing tobacco, and electronic cigarettes.  If you have any questions.  Other tests that may be performed during your first trimester include:  Blood tests to find your blood type and to check for the presence of any previous infections. The tests will also be used to check for low iron levels (anemia) and protein on red blood cells (Rh antibodies). Depending on your risk factors, or if you previously had diabetes during pregnancy, you may have tests to check for high blood  sugar that affects pregnant women (gestational diabetes).  Urine tests to check for infections, diabetes, or protein in the urine.  An ultrasound to confirm the proper growth and development of the baby.  Fetal screens for spinal cord problems (spina bifida) and Down syndrome.  HIV (human immunodeficiency virus) testing. Routine prenatal testing includes screening for HIV, unless you choose not to have this test.  You may need other tests to make sure you and the baby are doing well.  Follow these instructions at home: Medicines  Follow your health care provider's instructions regarding medicine use. Specific medicines may be either safe or unsafe to take during pregnancy.  Take a prenatal vitamin that contains at least 600 micrograms (mcg) of folic acid.  If you develop constipation, try taking a stool softener if your health care provider approves. Eating and drinking  Eat a balanced diet that includes fresh fruits and vegetables, whole grains, good sources of protein such as meat, eggs, or tofu, and low-fat dairy. Your health care provider will help you determine the amount of weight gain that is right for you.  Avoid raw meat and uncooked cheese. These carry germs that can cause birth defects in the baby.  Eating four or five small meals rather than three large meals a day may help relieve nausea and vomiting. If you start to feel nauseous, eating a few soda crackers can be helpful. Drinking liquids between meals, instead of during meals, also seems to help ease nausea and vomiting.  Limit foods that are high in fat and processed sugars, such as fried and sweet foods.  To prevent constipation: ? Eat foods that are high in fiber, such as fresh fruits and vegetables, whole grains, and beans. ? Drink enough fluid to keep your urine clear or pale yellow. Activity  Exercise only as directed by your health care provider. Most women can continue their usual exercise routine during  pregnancy. Try to exercise for 30 minutes at least 5 days a week. Exercising will help you: ? Control your weight. ? Stay in shape. ? Be prepared for labor and delivery.  Experiencing pain or cramping in the lower abdomen or lower back is a good sign that you should stop exercising. Check with your health care provider before continuing with normal exercises.  Try to avoid standing for long periods of time. Move your legs often if you must stand in one place for a long time.  Avoid heavy lifting.  Wear low-heeled shoes and practice good posture.  You may continue to have sex unless your health care provider tells you not to. Relieving pain and discomfort  Wear a good support bra to relieve breast tenderness.  Take warm sitz baths to soothe any pain or discomfort caused by hemorrhoids. Use hemorrhoid cream if your health care provider approves.  Rest with your legs elevated if you have leg cramps or low back pain.  If you develop   varicose veins in your legs, wear support hose. Elevate your feet for 15 minutes, 3-4 times a day. Limit salt in your diet. Prenatal care  Schedule your prenatal visits by the twelfth week of pregnancy. They are usually scheduled monthly at first, then more often in the last 2 months before delivery.  Write down your questions. Take them to your prenatal visits.  Keep all your prenatal visits as told by your health care provider. This is important. Safety  Wear your seat belt at all times when driving.  Make a list of emergency phone numbers, including numbers for family, friends, the hospital, and police and fire departments. General instructions  Ask your health care provider for a referral to a local prenatal education class. Begin classes no later than the beginning of month 6 of your pregnancy.  Ask for help if you have counseling or nutritional needs during pregnancy. Your health care provider can offer advice or refer you to specialists for help  with various needs.  Do not use hot tubs, steam rooms, or saunas.  Do not douche or use tampons or scented sanitary pads.  Do not cross your legs for long periods of time.  Avoid cat litter boxes and soil used by cats. These carry germs that can cause birth defects in the baby and possibly loss of the fetus by miscarriage or stillbirth.  Avoid all smoking, herbs, alcohol, and medicines not prescribed by your health care provider. Chemicals in these products affect the formation and growth of the baby.  Do not use any products that contain nicotine or tobacco, such as cigarettes and e-cigarettes. If you need help quitting, ask your health care provider. You may receive counseling support and other resources to help you quit.  Schedule a dentist appointment. At home, brush your teeth with a soft toothbrush and be gentle when you floss. Contact a health care provider if:  You have dizziness.  You have mild pelvic cramps, pelvic pressure, or nagging pain in the abdominal area.  You have persistent nausea, vomiting, or diarrhea.  You have a bad smelling vaginal discharge.  You have pain when you urinate.  You notice increased swelling in your face, hands, legs, or ankles.  You are exposed to fifth disease or chickenpox.  You are exposed to [place] measles (rubella) and have never had it. Get help right away if:  You have a fever.  You are leaking fluid from your vagina.  You have spotting or bleeding from your vagina.  You have severe abdominal cramping or pain.  You have rapid weight gain or loss.  You vomit blood or material that looks like coffee grounds.  You develop a severe headache.  You have shortness of breath.  You have any kind of trauma, such as from a fall or a car accident. Summary  The first trimester of pregnancy is from week 1 until the end of week 13 (months 1 through 3).  Your body goes through many changes during pregnancy. The changes vary from  woman to woman.  You will have routine prenatal visits. During those visits, your health care provider will examine you, discuss any test results you may have, and talk with you about how you are feeling. This information is not intended to replace advice given to you by your health care provider. Make sure you discuss any questions you have with your health care provider. Document Released: 12/25/2000 Document Revised: 12/13/2015 Document Reviewed: 12/13/2015 Elsevier Interactive Patient Education  2017 [employer]  

## 2016-11-18 NOTE — Progress Notes (Signed)
Subjective:     Patient ID: Gloria Alvarado, female   DOB: 1985/09/10, 31 y.o.   MRN: [id number]  HPI Gloria Alvarado is a 31 year old white female, married in for UPT, has missed a period and had +HPT.  Review of Systems +missed period with +HPT + cramps at night +heartburn Reviewed past medical,surgical, social and family history. Reviewed medications and allergies.     Objective:   Physical Exam BP 104/60 (BP Location: Left Arm, Patient Position: Sitting, Cuff Size: Normal)   Pulse 94   Resp 18   Ht 5\' 5"  (1.651 m)   Wt 166 lb (75.3 kg)   LMP 10/07/2016 (Approximate)   BMI 27.62 kg/m UPT +, about 6 weeks by LMP with EDD 07/15/17.Skin warm and dry. Neck: mid line trachea, normal thyroid, good ROM, no lymphadenopathy noted. Lungs: clear to ausculation bilaterally. Cardiovascular: regular rate and rhythm. PHQ 2 score 0. She is aware of after hours call service and that babies delivered at [facility].     Assessment:     1. Positive pregnancy test   2. Less than [redacted] weeks gestation of pregnancy   3. Encounter to determine fetal viability of pregnancy, single or unspecified fetus       Plan:     Ok to take Wellbutrin Return in 1 week for dating [place] Review handouts on first trimester and by [employer] [facility] Ok to take [employer]

## 2016-11-20 ENCOUNTER — Encounter: Payer: Self-pay | Admitting: Adult Health

## 2016-11-25 ENCOUNTER — Encounter: Payer: Self-pay | Admitting: Adult Health

## 2016-11-25 ENCOUNTER — Ambulatory Visit (INDEPENDENT_AMBULATORY_CARE_PROVIDER_SITE_OTHER): Payer: 59

## 2016-11-25 DIAGNOSIS — O3680X0 Pregnancy with inconclusive fetal viability, not applicable or unspecified: Secondary | ICD-10-CM | POA: Diagnosis not present

## 2016-11-25 DIAGNOSIS — Z3A01 Less than 8 weeks gestation of pregnancy: Secondary | ICD-10-CM

## 2016-11-25 NOTE — Progress Notes (Addendum)
[place] 6+4 wks GS w/ys,no fetal pole visualized,small subchorionic hemorrhage .8 x .4 x .8 cm,normal ovaries bilat,pt will come back in 10 days for a f/u ultrasound

## 2016-12-04 ENCOUNTER — Other Ambulatory Visit: Payer: Self-pay | Admitting: Obstetrics and Gynecology

## 2016-12-04 DIAGNOSIS — O3680X0 Pregnancy with inconclusive fetal viability, not applicable or unspecified: Secondary | ICD-10-CM

## 2016-12-09 ENCOUNTER — Encounter: Payer: Self-pay | Admitting: Adult Health

## 2016-12-09 ENCOUNTER — Ambulatory Visit (INDEPENDENT_AMBULATORY_CARE_PROVIDER_SITE_OTHER): Payer: 59

## 2016-12-09 DIAGNOSIS — Z3A01 Less than 8 weeks gestation of pregnancy: Secondary | ICD-10-CM

## 2016-12-09 DIAGNOSIS — O3680X0 Pregnancy with inconclusive fetal viability, not applicable or unspecified: Secondary | ICD-10-CM

## 2016-12-09 NOTE — Progress Notes (Signed)
[place] 7+2 wks,single IUP,positive fht 141 bpm,normal right ovary,left corpus luteal cyst 2.7 x 2.3 x 2.8 cm,crl 11.6 mm,EDD 07/26/2017 by today's ultrasound

## 2016-12-30 ENCOUNTER — Encounter: Payer: Self-pay | Admitting: Women's Health

## 2016-12-30 ENCOUNTER — Ambulatory Visit (INDEPENDENT_AMBULATORY_CARE_PROVIDER_SITE_OTHER): Payer: 59 | Admitting: Women's Health

## 2016-12-30 ENCOUNTER — Ambulatory Visit: Payer: 59 | Admitting: *Deleted

## 2016-12-30 VITALS — BP 100/78 | HR 82 | Wt 174.5 lb

## 2016-12-30 DIAGNOSIS — O99341 Other mental disorders complicating pregnancy, first trimester: Secondary | ICD-10-CM

## 2016-12-30 DIAGNOSIS — Z3A10 10 weeks gestation of pregnancy: Secondary | ICD-10-CM

## 2016-12-30 DIAGNOSIS — Z331 Pregnant state, incidental: Secondary | ICD-10-CM | POA: Diagnosis not present

## 2016-12-30 DIAGNOSIS — F418 Other specified anxiety disorders: Secondary | ICD-10-CM

## 2016-12-30 DIAGNOSIS — Z3401 Encounter for supervision of normal first pregnancy, first trimester: Secondary | ICD-10-CM

## 2016-12-30 DIAGNOSIS — Z3400 Encounter for supervision of normal first pregnancy, unspecified trimester: Secondary | ICD-10-CM | POA: Insufficient documentation

## 2016-12-30 DIAGNOSIS — Z1389 Encounter for screening for other disorder: Secondary | ICD-10-CM

## 2016-12-30 DIAGNOSIS — Z23 Encounter for immunization: Secondary | ICD-10-CM

## 2016-12-30 DIAGNOSIS — Z3682 Encounter for antenatal screening for nuchal translucency: Secondary | ICD-10-CM

## 2016-12-30 LAB — POCT URINALYSIS DIPSTICK
Blood, UA: NEGATIVE
Glucose, UA: NEGATIVE
Ketones, UA: NEGATIVE
LEUKOCYTES UA: NEGATIVE
NITRITE UA: NEGATIVE
PROTEIN UA: NEGATIVE

## 2016-12-30 NOTE — Patient Instructions (Signed)
Karle Plumber, I greatly value your feedback.  If you receive a survey following your visit with [place] today, we appreciate you taking the time to fill it out.  Thanks, Knute Neu, CNM, WHNP-BC   Nausea & Vomiting  Have saltine crackers or pretzels by your bed and eat a few bites before you raise your head out of bed in the morning  Eat small frequent meals throughout the day instead of large meals  Drink plenty of fluids throughout the day to stay hydrated, just don't drink a lot of fluids with your meals.  This can make your stomach fill up faster making you feel sick  Do not brush your teeth right after you eat  Products with real ginger are good for nausea, like ginger ale and ginger hard candy Make sure it says made with real ginger!  Sucking on sour candy like lemon heads is also good for nausea  If your prenatal vitamins make you nauseated, take them at night so you will sleep through the nausea  Sea Bands  If you feel like you need medicine for the nausea & vomiting please let us know  If you are unable to keep any fluids or food down please let us know   Constipation  Drink plenty of fluid, preferably water, throughout the day  Eat foods high in fiber such as fruits, vegetables, and grains  Exercise, such as walking, is a good way to keep your bowels regular  Drink warm fluids, especially warm prune juice, or decaf coffee  Eat a 1/2 cup of real oatmeal (not instant), 1/2 cup applesauce, and 1/2-1 cup warm prune juice every day  If needed, you may take Colace (docusate sodium) stool softener once or twice a day to help keep the stool soft. If you are pregnant, wait until you are out of your first trimester (12-14 weeks of pregnancy)  If you still are having problems with constipation, you may take Miralax once daily as needed to help keep your bowels regular.  If you are pregnant, wait until you are out of your first trimester (12-14 weeks of pregnancy)   First  Trimester of Pregnancy The first trimester of pregnancy is from week 1 until the end of week 12 (months 1 through 3). A week after a sperm fertilizes an egg, the egg will implant on the wall of the uterus. This embryo will begin to develop into a baby. Genes from you and your partner are forming the baby. The female genes determine whether the baby is a boy or a girl. At 6-8 weeks, the eyes and face are formed, and the heartbeat can be seen on ultrasound. At the end of 12 weeks, all the baby's organs are formed.  Now that you are pregnant, you will want to do everything you can to have a healthy baby. Two of the most important things are to get good prenatal care and to follow your health care provider's instructions. Prenatal care is all the medical care you receive before the baby's birth. This care will help prevent, find, and treat any problems during the pregnancy and childbirth. BODY CHANGES Your body goes through many changes during pregnancy. The changes vary from woman to woman.   You may gain or lose a couple of pounds at first.  You may feel sick to your stomach (nauseous) and throw up (vomit). If the vomiting is uncontrollable, call your health care provider.  You may tire easily.  You may develop headaches  that can be relieved by medicines approved by your health care provider.  You may urinate more often. Painful urination may mean you have a bladder infection.  You may develop heartburn as a result of your pregnancy.  You may develop constipation because certain hormones are causing the muscles that push waste through your intestines to slow down.  You may develop hemorrhoids or swollen, bulging veins (varicose veins).  Your breasts may begin to grow larger and become tender. Your nipples may stick out more, and the tissue that surrounds them (areola) may become darker.  Your gums may bleed and may be sensitive to brushing and flossing.  Dark spots or blotches (chloasma, mask  of pregnancy) may develop on your face. This will likely fade after the baby is born.  Your menstrual periods will stop.  You may have a loss of appetite.  You may develop cravings for certain kinds of food.  You may have changes in your emotions from day to day, such as being excited to be pregnant or being concerned that something may go wrong with the pregnancy and baby.  You may have more vivid and strange dreams.  You may have changes in your hair. These can include thickening of your hair, rapid growth, and changes in texture. Some women also have hair loss during or after pregnancy, or hair that feels dry or thin. Your hair will most likely return to normal after your baby is born. WHAT TO EXPECT AT YOUR PRENATAL VISITS During a routine prenatal visit:  You will be weighed to make sure you and the baby are growing normally.  Your blood pressure will be taken.  Your abdomen will be measured to track your baby's growth.  The fetal heartbeat will be listened to starting around week 10 or 12 of your pregnancy.  Test results from any previous visits will be discussed. Your health care provider may ask you:  How you are feeling.  If you are feeling the baby move.  If you have had any abnormal symptoms, such as leaking fluid, bleeding, severe headaches, or abdominal cramping.  If you have any questions. Other tests that may be performed during your first trimester include:  Blood tests to find your blood type and to check for the presence of any previous infections. They will also be used to check for low iron levels (anemia) and Rh antibodies. Later in the pregnancy, blood tests for diabetes will be done along with other tests if problems develop.  Urine tests to check for infections, diabetes, or protein in the urine.  An ultrasound to confirm the proper growth and development of the baby.  An amniocentesis to check for possible genetic problems.  Fetal screens for spina  bifida and Down syndrome.  You may need other tests to make sure you and the baby are doing well. HOME CARE INSTRUCTIONS  Medicines  Follow your health care provider's instructions regarding medicine use. Specific medicines may be either safe or unsafe to take during pregnancy.  Take your prenatal vitamins as directed.  If you develop constipation, try taking a stool softener if your health care provider approves. Diet  Eat regular, well-balanced meals. Choose a variety of foods, such as meat or vegetable-based protein, fish, milk and low-fat dairy products, vegetables, fruits, and whole grain breads and cereals. Your health care provider will help you determine the amount of weight gain that is right for you.  Avoid raw meat and uncooked cheese. These carry germs that can  cause birth defects in the baby.  Eating four or five small meals rather than three large meals a day may help relieve nausea and vomiting. If you start to feel nauseous, eating a few soda crackers can be helpful. Drinking liquids between meals instead of during meals also seems to help nausea and vomiting.  If you develop constipation, eat more high-fiber foods, such as fresh vegetables or fruit and whole grains. Drink enough fluids to keep your urine clear or pale yellow. Activity and Exercise  Exercise only as directed by your health care provider. Exercising will help you:  Control your weight.  Stay in shape.  Be prepared for labor and delivery.  Experiencing pain or cramping in the lower abdomen or low back is a good sign that you should stop exercising. Check with your health care provider before continuing normal exercises.  Try to avoid standing for long periods of time. Move your legs often if you must stand in one place for a long time.  Avoid heavy lifting.  Wear low-heeled shoes, and practice good posture.  You may continue to have sex unless your health care provider directs you  otherwise. Relief of Pain or Discomfort  Wear a good support bra for breast tenderness.   Take warm sitz baths to soothe any pain or discomfort caused by hemorrhoids. Use hemorrhoid cream if your health care provider approves.   Rest with your legs elevated if you have leg cramps or low back pain.  If you develop varicose veins in your legs, wear support hose. Elevate your feet for 15 minutes, 3-4 times a day. Limit salt in your diet. Prenatal Care  Schedule your prenatal visits by the twelfth week of pregnancy. They are usually scheduled monthly at first, then more often in the last 2 months before delivery.  Write down your questions. Take them to your prenatal visits.  Keep all your prenatal visits as directed by your health care provider. Safety  Wear your seat belt at all times when driving.  Make a list of emergency phone numbers, including numbers for family, friends, the hospital, and police and fire departments. General Tips  Ask your health care provider for a referral to a local prenatal education class. Begin classes no later than at the beginning of month 6 of your pregnancy.  Ask for help if you have counseling or nutritional needs during pregnancy. Your health care provider can offer advice or refer you to specialists for help with various needs.  Do not use hot tubs, steam rooms, or saunas.  Do not douche or use tampons or scented sanitary pads.  Do not cross your legs for long periods of time.  Avoid cat litter boxes and soil used by cats. These carry germs that can cause birth defects in the baby and possibly loss of the fetus by miscarriage or stillbirth.  Avoid all smoking, herbs, alcohol, and medicines not prescribed by your health care provider. Chemicals in these affect the formation and growth of the baby.  Schedule a dentist appointment. At home, brush your teeth with a soft toothbrush and be gentle when you floss. SEEK MEDICAL CARE IF:   You have  dizziness.  You have mild pelvic cramps, pelvic pressure, or nagging pain in the abdominal area.  You have persistent nausea, vomiting, or diarrhea.  You have a bad smelling vaginal discharge.  You have pain with urination.  You notice increased swelling in your face, hands, legs, or ankles. SEEK IMMEDIATE MEDICAL CARE IF:  You have a fever.  You are leaking fluid from your vagina.  You have spotting or bleeding from your vagina.  You have severe abdominal cramping or pain.  You have rapid weight gain or loss.  You vomit blood or material that looks like coffee grounds.  You are exposed to [place] measles and have never had them.  You are exposed to fifth disease or chickenpox.  You develop a severe headache.  You have shortness of breath.  You have any kind of trauma, such as from a fall or a car accident. Document Released: 12/25/2000 Document Revised: 05/17/2013 Document Reviewed: 11/10/2012 [facility] Patient Information 2015 [place], [place]. This information is not intended to replace advice given to you by your health care provider. Make sure you discuss any questions you have with your health care provider.

## 2016-12-30 NOTE — Progress Notes (Signed)
   INITIAL OBSTETRICAL VISIT Patient name: Gloria Alvarado MRN [id number]  Date of birth: 06/25/1985 Chief Complaint:   Initial Prenatal Visit  History of Present Illness:   Gloria Alvarado is a 31 y.o. G43P0000 Caucasian female at [redacted]w[redacted]d by 7wk u/s, with an Estimated Date of Delivery: 07/26/17 being seen today for her initial obstetrical visit.   Her obstetrical history is significant for primigravida. Dep/anxiety, doing well on wellbutrin.   Today she reports no complaints.  Patient's last menstrual period was 10/07/2016 (approximate). Last pap 05/23/14. Results were: normal Review of Systems:   Pertinent items are noted in HPI Denies cramping/contractions, leakage of fluid, vaginal bleeding, abnormal vaginal discharge w/ itching/odor/irritation, headaches, visual changes, shortness of breath, chest pain, abdominal pain, severe nausea/vomiting, or problems with urination or bowel movements unless otherwise stated above.  Pertinent History Reviewed:  Reviewed past medical,surgical, social, obstetrical and family history.  Reviewed problem list, medications and allergies. OB History  Gravida Para Term Preterm AB Living  1 0 0 0 0 0  SAB TAB Ectopic Multiple Live Births  0 0 0 0      # Outcome Date GA Lbr Len/2nd Weight Sex Delivery Anes PTL Lv  1 Current              Physical Assessment:   Vitals:   12/30/16 0948  BP: 100/78  Pulse: 82  Weight: 174 lb 8 oz (79.2 kg)  Body mass index is 29.04 kg/m.       Physical Examination:  General appearance - well appearing, and in no distress  Mental status - alert, oriented to person, place, and time  Psych:  She has a normal mood and affect  Skin - warm and dry, normal color, no suspicious lesions noted  Chest - effort normal, all lung fields clear to auscultation bilaterally  Heart - normal rate and regular rhythm  Abdomen - soft, nontender  Thin prep pap is not done   Fetal Heart Rate (bpm): + u/s via informal transabdominal u/s, w/  +active fetus  No results found for this or any previous visit (from the past 24 hour(s)).  Assessment & Plan:  1) Low-Risk Pregnancy G1P0000 at [redacted]w[redacted]d with an Estimated Date of Delivery: 07/26/17   2) Initial OB visit  3) Dep/anx> well controlled on wellbutrin  Initial labs obtained Continue prenatal vitamins Reviewed n/v relief measures and warning s/s to report Reviewed recommended weight gain based on pre-gravid BMI Encouraged well-balanced diet Genetic Screening discussed Integrated Screen: requested Cystic fibrosis screening discussed declined Ultrasound discussed; fetal survey: requested CCNC completed> not sent, not applying for preg Mcaid Flu shot today  Follow-up: Return in about 3 weeks (around 01/20/2017) for LROB, US:NT+1stIT.   Orders Placed This Encounter  Procedures  . GC/Chlamydia Probe Amp  . Urine Culture  . US Fetal Nuchal Translucency Measurement  . Flu Vaccine QUAD 36+ mos IM  . Obstetric Panel, Including HIV  . Urinalysis, Routine w reflex microscopic  . Pain Management Screening Profile (10S)  . POCT urinalysis dipstick    Tawnya Crook CNM, [facility] 12/30/2016 10:44 AM

## 2016-12-31 ENCOUNTER — Encounter: Payer: Self-pay | Admitting: Women's Health

## 2016-12-31 DIAGNOSIS — O9989 Other specified diseases and conditions complicating pregnancy, childbirth and the puerperium: Secondary | ICD-10-CM

## 2016-12-31 DIAGNOSIS — O09899 Supervision of other high risk pregnancies, unspecified trimester: Secondary | ICD-10-CM | POA: Insufficient documentation

## 2016-12-31 DIAGNOSIS — Z283 Underimmunization status: Secondary | ICD-10-CM | POA: Insufficient documentation

## 2016-12-31 LAB — MICROSCOPIC EXAMINATION: Casts: NONE SEEN /lpf

## 2016-12-31 LAB — URINALYSIS, ROUTINE W REFLEX MICROSCOPIC
BILIRUBIN UA: NEGATIVE
GLUCOSE, UA: NEGATIVE
KETONES UA: NEGATIVE
NITRITE UA: NEGATIVE
PROTEIN UA: NEGATIVE
RBC UA: NEGATIVE
SPEC GRAV UA: 1.014 (ref 1.005–1.030)
UUROB: 0.2 mg/dL (ref 0.2–1.0)
pH, UA: 5.5 (ref 5.0–7.5)

## 2016-12-31 LAB — OBSTETRIC PANEL, INCLUDING HIV
Antibody Screen: NEGATIVE
BASOS ABS: 0 10*3/uL (ref 0.0–0.2)
Basos: 0 %
EOS (ABSOLUTE): 0 10*3/uL (ref 0.0–0.4)
EOS: 1 %
HEMOGLOBIN: 13.7 g/dL (ref 11.1–15.9)
HIV Screen 4th Generation wRfx: NONREACTIVE
Hematocrit: 41.4 % (ref 34.0–46.6)
Hepatitis B Surface Ag: NEGATIVE
IMMATURE GRANS (ABS): 0 10*3/uL (ref 0.0–0.1)
IMMATURE GRANULOCYTES: 0 %
LYMPHS: 36 %
Lymphocytes Absolute: 2.3 10*3/uL (ref 0.7–3.1)
MCH: 31.2 pg (ref 26.6–33.0)
MCHC: 33.1 g/dL (ref 31.5–35.7)
MCV: 94 fL (ref 79–97)
Monocytes Absolute: 0.5 10*3/uL (ref 0.1–0.9)
Monocytes: 7 %
NEUTROS PCT: 56 %
Neutrophils Absolute: 3.6 10*3/uL (ref 1.4–7.0)
PLATELETS: 205 10*3/uL (ref 150–379)
RBC: 4.39 x10E6/uL (ref 3.77–5.28)
RDW: 13.5 % (ref 12.3–15.4)
RH TYPE: POSITIVE
RPR Ser Ql: NONREACTIVE
Rubella Antibodies, IGG: <0.9 index — ABNORMAL LOW (ref >0.99)
WBC: 6.5 10*3/uL (ref 3.4–10.8)

## 2017-01-01 LAB — PMP SCREEN PROFILE (10S), URINE
AMPHETAMINE SCREEN URINE: NEGATIVE ng/mL
BARBITURATE SCREEN URINE: NEGATIVE ng/mL
BENZODIAZEPINE SCREEN, URINE: NEGATIVE ng/mL
CANNABINOIDS UR QL SCN: NEGATIVE ng/mL
CREATININE(CRT), U: 67.8 mg/dL (ref 20.0–300.0)
Cocaine (Metab) Scrn, Ur: NEGATIVE ng/mL
Methadone Screen, Urine: NEGATIVE ng/mL
OPIATE SCREEN URINE: NEGATIVE ng/mL
OXYCODONE+OXYMORPHONE UR QL SCN: NEGATIVE ng/mL
PH UR, DRUG SCRN: 5.4 (ref 4.5–8.9)
PHENCYCLIDINE QUANTITATIVE URINE: NEGATIVE ng/mL
Propoxyphene Scrn, Ur: NEGATIVE ng/mL

## 2017-01-01 LAB — GC/CHLAMYDIA PROBE AMP
Chlamydia trachomatis, NAA: NEGATIVE
Neisseria gonorrhoeae by PCR: NEGATIVE

## 2017-01-01 LAB — URINE CULTURE: ORGANISM ID, BACTERIA: NO GROWTH

## 2017-01-14 NOTE — L&D Delivery Note (Signed)
Patient is 32 y.o. G1P1001 [redacted]w[redacted]d admitted IOL for SUA. S/p IOL with foley bulb, cytotec, followed by Pitocin. SROM at 1258.  Prenatal course also complicated by depression.  GBS psoitive  Delivery Note At 2:25 AM a viable female was delivered via Vaginal, Spontaneous (Presentation: ROA).  APGAR: 8, 9; weight 8 lb 1.3 oz (3665 g).   Placenta status: Intact.  Cord: 2V with the following complications: None.  Cord pH: N/A  Anesthesia: Epidural Episiotomy: None Lacerations: 1st degree Suture Repair: 3.0 vicryl Est. Blood Loss (mL): 350  Mom to postpartum.  Baby to Couplet care / Skin to Skin.  Luiz Blare, DO 07/27/2017, 4:08 AM OB [facility], [facility]

## 2017-01-20 ENCOUNTER — Encounter: Payer: Self-pay | Admitting: Women's Health

## 2017-01-20 ENCOUNTER — Ambulatory Visit (INDEPENDENT_AMBULATORY_CARE_PROVIDER_SITE_OTHER): Payer: 59 | Admitting: Women's Health

## 2017-01-20 ENCOUNTER — Ambulatory Visit (INDEPENDENT_AMBULATORY_CARE_PROVIDER_SITE_OTHER): Payer: 59

## 2017-01-20 VITALS — BP 100/70 | HR 100 | Wt 179.0 lb

## 2017-01-20 DIAGNOSIS — Z3682 Encounter for antenatal screening for nuchal translucency: Secondary | ICD-10-CM

## 2017-01-20 DIAGNOSIS — Z331 Pregnant state, incidental: Secondary | ICD-10-CM

## 2017-01-20 DIAGNOSIS — Z3A13 13 weeks gestation of pregnancy: Secondary | ICD-10-CM

## 2017-01-20 DIAGNOSIS — Z1379 Encounter for other screening for genetic and chromosomal anomalies: Secondary | ICD-10-CM

## 2017-01-20 DIAGNOSIS — Z1389 Encounter for screening for other disorder: Secondary | ICD-10-CM

## 2017-01-20 DIAGNOSIS — Z3401 Encounter for supervision of normal first pregnancy, first trimester: Secondary | ICD-10-CM

## 2017-01-20 LAB — POCT URINALYSIS DIPSTICK
Blood, UA: NEGATIVE
GLUCOSE UA: NEGATIVE
KETONES UA: NEGATIVE
Nitrite, UA: NEGATIVE
Protein, UA: NEGATIVE

## 2017-01-20 NOTE — Progress Notes (Signed)
   LOW-RISK PREGNANCY VISIT Patient name: Gloria Alvarado MRN [id number]  Date of birth: [birth year] Chief Complaint:   Routine Prenatal Visit (ultrasound/ 1st IT)  History of Present Illness:   Gloria Alvarado is a 32 y.o. G25P0000 female at [redacted]w[redacted]d with an Estimated Date of Delivery: 07/26/17 being seen today for ongoing management of a low-risk pregnancy.  Today she reports no complaints. Going to [place] 1/16-1/23, [place]. Contractions: Not present.  .  Movement: Absent. denies leaking of fluid. Review of Systems:   Pertinent items are noted in HPI Denies abnormal vaginal discharge w/ itching/odor/irritation, headaches, visual changes, shortness of breath, chest pain, abdominal pain, severe nausea/vomiting, or problems with urination or bowel movements unless otherwise stated above. Pertinent History Reviewed:  Reviewed past medical,surgical, social, obstetrical and family history.  Reviewed problem list, medications and allergies. Physical Assessment:   Vitals:   01/20/17 1019  BP: 100/70  Pulse: 100  Weight: 179 lb (81.2 kg)  Body mass index is 29.79 kg/m.        Physical Examination:   General appearance: Well appearing, and in no distress  Mental status: Alert, oriented to person, place, and time  Skin: Warm & dry  Cardiovascular: Normal heart rate noted  Respiratory: Normal respiratory effort, no distress  Abdomen: Soft, gravid, nontender  Pelvic: Cervical exam deferred         Extremities: Edema: None  Fetal Status: Fetal Heart Rate (bpm): 146 u/s   Movement: Absent    NT [place] 13+2 wks,measurements c/w dates,crl 72.41 mm,right ovary not visualized,normal left ovary,NB present,NT 2.1 mm,fhr 146 bpm,posterior pl gr 0  Results for orders placed or performed in visit on 01/20/17 (from the past 24 hour(s))  POCT urinalysis dipstick   Collection Time: 01/20/17 10:24 AM  Result Value Ref Range   Color, UA     Clarity, UA     Glucose, UA neg    Bilirubin, UA     Ketones, UA  neg    Spec Grav, UA  1.010 - 1.025   Blood, UA neg    pH, UA  5.0 - 8.0   Protein, UA neg    Urobilinogen, UA  0.2 or 1.0 E.U./dL   Nitrite, UA neg    Leukocytes, UA Small (1+) (A) Negative   Appearance     Odor      Assessment & Plan:  1) Low-risk pregnancy G1P0000 at [redacted]w[redacted]d with an Estimated Date of Delivery: 07/26/17    No orders of the defined types were placed in this encounter.   Labs/procedures today: 1st IT/NT  Plan:  Continue routine obstetrical care   Reviewed: Preterm labor symptoms and general obstetric precautions including but not limited to vaginal bleeding, contractions, leaking of fluid and fetal movement were reviewed in detail with the patient.  All questions were answered  Follow-up: Return in about 3 weeks (around 02/10/2017) for [place], 2nd IT.  Orders Placed This Encounter  Procedures  . Integrated 1  . POCT urinalysis dipstick   Tawnya Crook CNM, [facility] 01/20/2017 10:40 AM

## 2017-01-20 NOTE — Patient Instructions (Signed)
Gloria Alvarado, I greatly value your feedback.  If you receive a survey following your visit with [place] today, we appreciate you taking the time to fill it out.  Thanks, Knute Neu, CNM, WHNP-BC   Second Trimester of Pregnancy The second trimester is from week 14 through week 27 (months 4 through 6). The second trimester is often a time when you feel your best. Your body has adjusted to being pregnant, and you begin to feel better physically. Usually, morning sickness has lessened or quit completely, you may have more energy, and you may have an increase in appetite. The second trimester is also a time when the fetus is growing rapidly. At the end of the sixth month, the fetus is about 9 inches long and weighs about 1 pounds. You will likely begin to feel the baby move (quickening) between 16 and 20 weeks of pregnancy. Body changes during your second trimester Your body continues to go through many changes during your second trimester. The changes vary from woman to woman.  Your weight will continue to increase. You will notice your lower abdomen bulging out.  You may begin to get stretch marks on your hips, abdomen, and breasts.  You may develop headaches that can be relieved by medicines. The medicines should be approved by your health care provider.  You may urinate more often because the fetus is pressing on your bladder.  You may develop or continue to have heartburn as a result of your pregnancy.  You may develop constipation because certain hormones are causing the muscles that push waste through your intestines to slow down.  You may develop hemorrhoids or swollen, bulging veins (varicose veins).  You may have back pain. This is caused by: ? Weight gain. ? Pregnancy hormones that are relaxing the joints in your pelvis. ? A shift in weight and the muscles that support your balance.  Your breasts will continue to grow and they will continue to become tender.  Your gums may bleed  and may be sensitive to brushing and flossing.  Dark spots or blotches (chloasma, mask of pregnancy) may develop on your face. This will likely fade after the baby is born.  A dark line from your belly button to the pubic area (linea nigra) may appear. This will likely fade after the baby is born.  You may have changes in your hair. These can include thickening of your hair, rapid growth, and changes in texture. Some women also have hair loss during or after pregnancy, or hair that feels dry or thin. Your hair will most likely return to normal after your baby is born.  What to expect at prenatal visits During a routine prenatal visit:  You will be weighed to make sure you and the fetus are growing normally.  Your blood pressure will be taken.  Your abdomen will be measured to track your baby's growth.  The fetal heartbeat will be listened to.  Any test results from the previous visit will be discussed.  Your health care provider may ask you:  How you are feeling.  If you are feeling the baby move.  If you have had any abnormal symptoms, such as leaking fluid, bleeding, severe headaches, or abdominal cramping.  If you are using any tobacco products, including cigarettes, chewing tobacco, and electronic cigarettes.  If you have any questions.  Other tests that may be performed during your second trimester include:  Blood tests that check for: ? Low iron levels (anemia). ? High  blood sugar that affects pregnant women (gestational diabetes) between 3 and 28 weeks. ? Rh antibodies. This is to check for a protein on red blood cells (Rh factor).  Urine tests to check for infections, diabetes, or protein in the urine.  An ultrasound to confirm the proper growth and development of the baby.  An amniocentesis to check for possible genetic problems.  Fetal screens for spina bifida and Down syndrome.  HIV (human immunodeficiency virus) testing. Routine prenatal testing includes  screening for HIV, unless you choose not to have this test.  Follow these instructions at home: Medicines  Follow your health care provider's instructions regarding medicine use. Specific medicines may be either safe or unsafe to take during pregnancy.  Take a prenatal vitamin that contains at least 600 micrograms (mcg) of folic acid.  If you develop constipation, try taking a stool softener if your health care provider approves. Eating and drinking  Eat a balanced diet that includes fresh fruits and vegetables, whole grains, good sources of protein such as meat, eggs, or tofu, and low-fat dairy. Your health care provider will help you determine the amount of weight gain that is right for you.  Avoid raw meat and uncooked cheese. These carry germs that can cause birth defects in the baby.  If you have low calcium intake from food, talk to your health care provider about whether you should take a daily calcium supplement.  Limit foods that are high in fat and processed sugars, such as fried and sweet foods.  To prevent constipation: ? Drink enough fluid to keep your urine clear or pale yellow. ? Eat foods that are high in fiber, such as fresh fruits and vegetables, whole grains, and beans. Activity  Exercise only as directed by your health care provider. Most women can continue their usual exercise routine during pregnancy. Try to exercise for 30 minutes at least 5 days a week. Stop exercising if you experience uterine contractions.  Avoid heavy lifting, wear low heel shoes, and practice good posture.  A sexual relationship may be continued unless your health care provider directs you otherwise. Relieving pain and discomfort  Wear a good support bra to prevent discomfort from breast tenderness.  Take warm sitz baths to soothe any pain or discomfort caused by hemorrhoids. Use hemorrhoid cream if your health care provider approves.  Rest with your legs elevated if you have leg cramps  or low back pain.  If you develop varicose veins, wear support hose. Elevate your feet for 15 minutes, 3-4 times a day. Limit salt in your diet. Prenatal Care  Write down your questions. Take them to your prenatal visits.  Keep all your prenatal visits as told by your health care provider. This is important. Safety  Wear your seat belt at all times when driving.  Make a list of emergency phone numbers, including numbers for family, friends, the hospital, and police and fire departments. General instructions  Ask your health care provider for a referral to a local prenatal education class. Begin classes no later than the beginning of month 6 of your pregnancy.  Ask for help if you have counseling or nutritional needs during pregnancy. Your health care provider can offer advice or refer you to specialists for help with various needs.  Do not use hot tubs, steam rooms, or saunas.  Do not douche or use tampons or scented sanitary pads.  Do not cross your legs for long periods of time.  Avoid cat litter boxes and soil  used by cats. These carry germs that can cause birth defects in the baby and possibly loss of the fetus by miscarriage or stillbirth.  Avoid all smoking, herbs, alcohol, and unprescribed drugs. Chemicals in these products can affect the formation and growth of the baby.  Do not use any products that contain nicotine or tobacco, such as cigarettes and e-cigarettes. If you need help quitting, ask your health care provider.  Visit your dentist if you have not gone yet during your pregnancy. Use a soft toothbrush to brush your teeth and be gentle when you floss. Contact a health care provider if:  You have dizziness.  You have mild pelvic cramps, pelvic pressure, or nagging pain in the abdominal area.  You have persistent nausea, vomiting, or diarrhea.  You have a bad smelling vaginal discharge.  You have pain when you urinate. Get help right away if:  You have a  fever.  You are leaking fluid from your vagina.  You have spotting or bleeding from your vagina.  You have severe abdominal cramping or pain.  You have rapid weight gain or weight loss.  You have shortness of breath with chest pain.  You notice sudden or extreme swelling of your face, hands, ankles, feet, or legs.  You have not felt your baby move in over an hour.  You have severe headaches that do not go away when you take medicine.  You have vision changes. Summary  The second trimester is from week 14 through week 27 (months 4 through 6). It is also a time when the fetus is growing rapidly.  Your body goes through many changes during pregnancy. The changes vary from woman to woman.  Avoid all smoking, herbs, alcohol, and unprescribed drugs. These chemicals affect the formation and growth your baby.  Do not use any tobacco products, such as cigarettes, chewing tobacco, and e-cigarettes. If you need help quitting, ask your health care provider.  Contact your health care provider if you have any questions. Keep all prenatal visits as told by your health care provider. This is important. This information is not intended to replace advice given to you by your health care provider. Make sure you discuss any questions you have with your health care provider. Document Released: 12/25/2000 Document Revised: 06/08/2015 Document Reviewed: 03/03/2012 Elsevier Interactive Patient Education  2017 [employer].

## 2017-01-20 NOTE — Progress Notes (Signed)
[place] 13+2 wks,measurements c/w dates,crl 72.41 mm,right ovary not visualized,normal left ovary,NB present,NT 2.1 mm,fhr 146 bpm,posterior pl gr 0

## 2017-01-23 LAB — INTEGRATED 1
Crown Rump Length: 72.4 mm
GEST. AGE ON COLLECTION DATE: 13 wk
Maternal Age at EDD: 32 yr
NUCHAL TRANSLUCENCY (NT): 2.1 mm
Number of Fetuses: 1
PAPP-A VALUE: 621.4 ng/mL
WEIGHT: 179 [lb_av]

## 2017-02-10 ENCOUNTER — Encounter: Payer: Self-pay | Admitting: Obstetrics and Gynecology

## 2017-02-10 ENCOUNTER — Ambulatory Visit (INDEPENDENT_AMBULATORY_CARE_PROVIDER_SITE_OTHER): Payer: 59 | Admitting: Obstetrics and Gynecology

## 2017-02-10 VITALS — BP 102/70 | HR 105 | Wt 185.0 lb

## 2017-02-10 DIAGNOSIS — Z1389 Encounter for screening for other disorder: Secondary | ICD-10-CM

## 2017-02-10 DIAGNOSIS — Z331 Pregnant state, incidental: Secondary | ICD-10-CM

## 2017-02-10 DIAGNOSIS — J069 Acute upper respiratory infection, unspecified: Secondary | ICD-10-CM

## 2017-02-10 DIAGNOSIS — O99519 Diseases of the respiratory system complicating pregnancy, unspecified trimester: Secondary | ICD-10-CM

## 2017-02-10 DIAGNOSIS — Z3A16 16 weeks gestation of pregnancy: Secondary | ICD-10-CM

## 2017-02-10 DIAGNOSIS — Z1379 Encounter for other screening for genetic and chromosomal anomalies: Secondary | ICD-10-CM

## 2017-02-10 LAB — POCT URINALYSIS DIPSTICK
Blood, UA: NEGATIVE
Glucose, UA: NEGATIVE
KETONES UA: NEGATIVE
LEUKOCYTES UA: NEGATIVE
NITRITE UA: NEGATIVE
PROTEIN UA: NEGATIVE

## 2017-02-10 NOTE — Progress Notes (Signed)
Patient ID: Gloria Alvarado, female   DOB: 03-Feb-1985, 31 y.o.   MRN: [id number]   LOW-RISK PREGNANCY VISIT Patient name: Gloria Alvarado MRN [id number]  Date of birth: 1985-03-25 Chief Complaint:   Routine Prenatal Visit (chest/head congestion)  History of Present Illness:   Gloria Alvarado is a 32 y.o. G73P0000 female at [redacted]w[redacted]d with an Estimated Date of Delivery: 07/26/17 being seen today for ongoing management of a low-risk pregnancy.  Today she reports a chest and head congestion.She has been taking muchinex for her symptoms. Contractions: Not present. Vag. Bleeding: None.  Movement: Absent. denies leaking of fluid. Review of Systems:   Pertinent items are noted in HPI Denies abnormal vaginal discharge w/ itching/odor/irritation, headaches, visual changes, shortness of breath, chest pain, abdominal pain, severe nausea/vomiting, or problems with urination or bowel movements unless otherwise stated above. Pertinent History Reviewed:  Reviewed past medical,surgical, social, obstetrical and family history.  Reviewed problem list, medications and allergies. Physical Assessment:   Vitals:   02/10/17 0946  BP: 102/70  Pulse: (!) 105  Weight: 185 lb (83.9 kg)  Body mass index is 30.79 kg/m.        Physical Examination:   General appearance: Well appearing, and in no distress  Mental status: Alert, oriented to person, place, and time  Skin: Warm & dry  Cardiovascular: Normal heart rate noted  Respiratory: Normal respiratory effort, no distress  Abdomen: Soft, gravid, nontender  Pelvic: Cervical exam deferred         Extremities: Edema: None  Fetal Status: Fetal Heart Rate (bpm): 149 Fundal Height: 17 cm Movement: Absent    Results for orders placed or performed in visit on 02/10/17 (from the past 24 hour(s))  POCT urinalysis dipstick   Collection Time: 02/10/17  9:50 AM  Result Value Ref Range   Color, UA     Clarity, UA     Glucose, UA neg    Bilirubin, UA     Ketones, UA neg    Spec Grav, UA  1.010 - 1.025   Blood, UA neg    pH, UA  5.0 - 8.0   Protein, UA neg    Urobilinogen, UA  0.2 or 1.0 E.U./dL   Nitrite, UA neg    Leukocytes, UA Negative Negative   Appearance     Odor      Assessment & Plan:  1) Low-risk pregnancy G1P0000 at [redacted]w[redacted]d with an Estimated Date of Delivery: 07/26/17   2. Mild URI Meds: No orders of the defined types were placed in this encounter.  Labs/procedures today: 2nd IT  Plan:  Continue routine obstetrical care   Follow-up: Return in about 3 weeks (around 03/03/2017) for LROB, U/S: ANATOMY.  Orders Placed This Encounter  Procedures  . INTEGRATED 2  . POCT urinalysis dipstick   By signing my name below, I, Margit Banda, attest that this documentation has been prepared under the direction and in the presence of Jonnie Kind, MD. Electronically Signed: Margit Banda, Medical Scribe. 02/10/17. 10:13 AM.  I personally performed the services described in this documentation, which was SCRIBED in my presence. The recorded information has been reviewed and considered accurate. It has been edited as necessary during review. Jonnie Kind, MD

## 2017-02-10 NOTE — Patient Instructions (Signed)
(  [phone] is the phone number for Pregnancy Classes or hospital tours at [facility].  ° °You will be referred to  [email or web address]   for more information on childbirth classes   °At this site you may register for classes. You may sign up for a waiting list if classes are full. Please SIGN UP FOR THIS!.   When the waiting list becomes long, sometimes new classes can be added. ° ° ° °

## 2017-02-11 ENCOUNTER — Other Ambulatory Visit: Payer: Self-pay | Admitting: Advanced Practice Midwife

## 2017-02-11 ENCOUNTER — Telehealth: Payer: Self-pay | Admitting: *Deleted

## 2017-02-11 ENCOUNTER — Encounter: Payer: Self-pay | Admitting: Advanced Practice Midwife

## 2017-02-11 ENCOUNTER — Encounter: Payer: Self-pay | Admitting: Adult Health

## 2017-02-11 MED ORDER — AMOXICILLIN 500 MG PO CAPS: 500.0000 mg | ORAL_CAPSULE | Freq: Three times a day (TID) | ORAL | 0 refills | Status: DC

## 2017-02-11 NOTE — Progress Notes (Unsigned)
Amoxicillin fo rUIR

## 2017-02-11 NOTE — Telephone Encounter (Signed)
Patient was seen yesterday with Dr Glo Herring and mentioned her cold to him and what she had been taken. But as of late yesterday afternoon, patient states she has gotten even worst. Head and teeth and constantly pounding and still has a ton of congestion. Please advise.

## 2017-02-14 LAB — INTEGRATED 2
AFP MoM: 1.55
Alpha-Fetoprotein: 41.1 ng/mL
Crown Rump Length: 72.4 mm
DIA MoM: 0.82
DIA VALUE: 126.4 pg/mL
Estriol, Unconjugated: 0.64 ng/mL
GEST. AGE ON COLLECTION DATE: 13 wk
Gestational Age: 16 weeks
HCG MOM: 0.81
HCG VALUE: 25 [IU]/mL
MATERNAL AGE AT EDD: 32 a
NUCHAL TRANSLUCENCY (NT): 2.1 mm
Nuchal Translucency MoM: 1.18
Number of Fetuses: 1
PAPP-A MOM: 0.65
PAPP-A Value: 621.4 ng/mL
Test Results:: NEGATIVE
WEIGHT: 179 [lb_av]
Weight: 179 [lb_av]
uE3 MoM: 0.85

## 2017-02-28 ENCOUNTER — Other Ambulatory Visit: Payer: Self-pay | Admitting: Obstetrics and Gynecology

## 2017-02-28 DIAGNOSIS — Z363 Encounter for antenatal screening for malformations: Secondary | ICD-10-CM

## 2017-03-03 ENCOUNTER — Ambulatory Visit (INDEPENDENT_AMBULATORY_CARE_PROVIDER_SITE_OTHER): Payer: 59

## 2017-03-03 ENCOUNTER — Encounter: Payer: Self-pay | Admitting: Obstetrics & Gynecology

## 2017-03-03 ENCOUNTER — Ambulatory Visit (INDEPENDENT_AMBULATORY_CARE_PROVIDER_SITE_OTHER): Payer: 59 | Admitting: Obstetrics & Gynecology

## 2017-03-03 VITALS — BP 110/88 | HR 96 | Wt 190.0 lb

## 2017-03-03 DIAGNOSIS — Z3402 Encounter for supervision of normal first pregnancy, second trimester: Secondary | ICD-10-CM

## 2017-03-03 DIAGNOSIS — Z363 Encounter for antenatal screening for malformations: Secondary | ICD-10-CM

## 2017-03-03 DIAGNOSIS — Z1389 Encounter for screening for other disorder: Secondary | ICD-10-CM

## 2017-03-03 DIAGNOSIS — Z331 Pregnant state, incidental: Secondary | ICD-10-CM

## 2017-03-03 DIAGNOSIS — Z3A19 19 weeks gestation of pregnancy: Secondary | ICD-10-CM

## 2017-03-03 LAB — POCT URINALYSIS DIPSTICK
Glucose, UA: NEGATIVE
Ketones, UA: NEGATIVE
LEUKOCYTES UA: NEGATIVE
NITRITE UA: NEGATIVE
PROTEIN UA: NEGATIVE
RBC UA: NEGATIVE

## 2017-03-03 NOTE — Progress Notes (Signed)
G1P0000 [redacted]w[redacted]d Estimated Date of Delivery: 07/26/17  Blood pressure 110/88, pulse 96, weight 190 lb (86.2 kg), last menstrual period 10/07/2016.   BP weight and urine results all reviewed and noted.  Please refer to the obstetrical flow sheet for the fundal height and fetal heart rate documentation:  Patient reports good fetal movement, denies any bleeding and no rupture of membranes symptoms or regular contractions. Patient is without complaints. All questions were answered.  Orders Placed This Encounter  Procedures  . POCT urinalysis dipstick    Plan:  Continued routine obstetrical care, SUA scan otherwise normal, track 3rd trimester growth  Return in about 4 weeks (around 03/31/2017) for LROB.

## 2017-03-03 NOTE — Progress Notes (Signed)
[place] 03+8 wks,cephalic,cx 4.9 cm,post pl gr 0,right ovary not visualize,normal left ovary,2VC,SVP of fluid 3.4 cm,fhr 148 bpm,efw 321 g 79%,anatomy complete

## 2017-03-31 ENCOUNTER — Ambulatory Visit (INDEPENDENT_AMBULATORY_CARE_PROVIDER_SITE_OTHER): Payer: 59 | Admitting: Women's Health

## 2017-03-31 ENCOUNTER — Encounter: Payer: Self-pay | Admitting: Women's Health

## 2017-03-31 VITALS — BP 138/70 | HR 88 | Wt 197.5 lb

## 2017-03-31 DIAGNOSIS — O358XX0 Maternal care for other (suspected) fetal abnormality and damage, not applicable or unspecified: Secondary | ICD-10-CM

## 2017-03-31 DIAGNOSIS — Z331 Pregnant state, incidental: Secondary | ICD-10-CM

## 2017-03-31 DIAGNOSIS — Q270 Congenital absence and hypoplasia of umbilical artery: Secondary | ICD-10-CM

## 2017-03-31 DIAGNOSIS — Z3402 Encounter for supervision of normal first pregnancy, second trimester: Secondary | ICD-10-CM

## 2017-03-31 DIAGNOSIS — Z3A23 23 weeks gestation of pregnancy: Secondary | ICD-10-CM

## 2017-03-31 DIAGNOSIS — O09899 Supervision of other high risk pregnancies, unspecified trimester: Secondary | ICD-10-CM

## 2017-03-31 DIAGNOSIS — Z1389 Encounter for screening for other disorder: Secondary | ICD-10-CM

## 2017-03-31 LAB — POCT URINALYSIS DIPSTICK
Blood, UA: NEGATIVE
Glucose, UA: NEGATIVE
KETONES UA: NEGATIVE
NITRITE UA: NEGATIVE
PROTEIN UA: NEGATIVE

## 2017-03-31 NOTE — Patient Instructions (Signed)
Gloria Alvarado, I greatly value your feedback.  If you receive a survey following your visit with [place] today, we appreciate you taking the time to fill it out.  Thanks, Knute Neu, CNM, WHNP-BC   You will have your sugar test next visit.  Please do not eat or drink anything after midnight the night before you come, not even water.  You will be here for at least two hours.     Call the office [phone]) or go to [facility] if:  You begin to have strong, frequent contractions  Your water breaks.  Sometimes it is a big gush of fluid, sometimes it is just a trickle that keeps getting your panties wet or running down your legs  You have vaginal bleeding.  It is normal to have a small amount of spotting if your cervix was checked.   You don't feel your baby moving like normal.  If you don't, get you something to eat and drink and lay down and focus on feeling your baby move.   If your baby is still not moving like normal, you should call the office or go to [place] [facility] P[facility] The second trimester is from week 13 through week 28, months 4 through 6. The second trimester is often a time when you feel your best. Your body has also adjusted to being pregnant, and you begin to feel better physically. Usually, morning sickness has lessened or quit completely, you may have more energy, and you may have an increase in appetite. The second trimester is also a time when the fetus is growing rapidly. At the end of the sixth month, the fetus is about 9 inches long and weighs about 1 pounds. You will likely begin to feel the baby move (quickening) between 18 and 20 weeks of the pregnancy. BODY CHANGES Your body goes through many changes during pregnancy. The changes vary from woman to woman.   Your weight will continue to increase. You will notice your lower abdomen bulging out.  You may begin to get stretch marks on your hips, abdomen, and breasts.  You may develop  headaches that can be relieved by medicines approved by your health care provider.  You may urinate more often because the fetus is pressing on your bladder.  You may develop or continue to have heartburn as a result of your pregnancy.  You may develop constipation because certain hormones are causing the muscles that push waste through your intestines to slow down.  You may develop hemorrhoids or swollen, bulging veins (varicose veins).  You may have back pain because of the weight gain and pregnancy hormones relaxing your joints between the bones in your pelvis and as a result of a shift in weight and the muscles that support your balance.  Your breasts will continue to grow and be tender.  Your gums may bleed and may be sensitive to brushing and flossing.  Dark spots or blotches (chloasma, mask of pregnancy) may develop on your face. This will likely fade after the baby is born.  A dark line from your belly button to the pubic area (linea nigra) may appear. This will likely fade after the baby is born.  You may have changes in your hair. These can include thickening of your hair, rapid growth, and changes in texture. Some women also have hair loss during or after pregnancy, or hair that feels dry or thin. Your hair will most likely return to normal after your  baby is born. WHAT TO EXPECT AT YOUR PRENATAL VISITS During a routine prenatal visit:  You will be weighed to make sure you and the fetus are growing normally.  Your blood pressure will be taken.  Your abdomen will be measured to track your baby's growth.  The fetal heartbeat will be listened to.  Any test results from the previous visit will be discussed. Your health care provider may ask you:  How you are feeling.  If you are feeling the baby move.  If you have had any abnormal symptoms, such as leaking fluid, bleeding, severe headaches, or abdominal cramping.  If you have any questions. Other tests that may be  performed during your second trimester include:  Blood tests that check for:  Low iron levels (anemia).  Gestational diabetes (between 24 and 28 weeks).  Rh antibodies.  Urine tests to check for infections, diabetes, or protein in the urine.  An ultrasound to confirm the proper growth and development of the baby.  An amniocentesis to check for possible genetic problems.  Fetal screens for spina bifida and Down syndrome. HOME CARE INSTRUCTIONS   Avoid all smoking, herbs, alcohol, and unprescribed drugs. These chemicals affect the formation and growth of the baby.  Follow your health care provider's instructions regarding medicine use. There are medicines that are either safe or unsafe to take during pregnancy.  Exercise only as directed by your health care provider. Experiencing uterine cramps is a good sign to stop exercising.  Continue to eat regular, healthy meals.  Wear a good support bra for breast tenderness.  Do not use hot tubs, steam rooms, or saunas.  Wear your seat belt at all times when driving.  Avoid raw meat, uncooked cheese, cat litter boxes, and soil used by cats. These carry germs that can cause birth defects in the baby.  Take your prenatal vitamins.  Try taking a stool softener (if your health care provider approves) if you develop constipation. Eat more high-fiber foods, such as fresh vegetables or fruit and whole grains. Drink plenty of fluids to keep your urine clear or pale yellow.  Take warm sitz baths to soothe any pain or discomfort caused by hemorrhoids. Use hemorrhoid cream if your health care provider approves.  If you develop varicose veins, wear support hose. Elevate your feet for 15 minutes, 3-4 times a day. Limit salt in your diet.  Avoid heavy lifting, wear low heel shoes, and practice good posture.  Rest with your legs elevated if you have leg cramps or low back pain.  Visit your dentist if you have not gone yet during your pregnancy.  Use a soft toothbrush to brush your teeth and be gentle when you floss.  A sexual relationship may be continued unless your health care provider directs you otherwise.  Continue to go to all your prenatal visits as directed by your health care provider. SEEK MEDICAL CARE IF:   You have dizziness.  You have mild pelvic cramps, pelvic pressure, or nagging pain in the abdominal area.  You have persistent nausea, vomiting, or diarrhea.  You have a bad smelling vaginal discharge.  You have pain with urination. SEEK IMMEDIATE MEDICAL CARE IF:   You have a fever.  You are leaking fluid from your vagina.  You have spotting or bleeding from your vagina.  You have severe abdominal cramping or pain.  You have rapid weight gain or loss.  You have shortness of breath with chest pain.  You notice sudden or extreme   swelling of your face, hands, ankles, feet, or legs.  You have not felt your baby move in over an hour.  You have severe headaches that do not go away with medicine.  You have vision changes. Document Released: 12/25/2000 Document Revised: 01/05/2013 Document Reviewed: 03/03/2012 [facility] Patient Information 2015 [place], [place]. This information is not intended to replace advice given to you by your health care provider. Make sure you discuss any questions you have with your health care provider.

## 2017-03-31 NOTE — Progress Notes (Signed)
   LOW-RISK PREGNANCY VISIT Patient name: Gloria Alvarado MRN [id number]  Date of birth: [birth year] Chief Complaint:   Routine Prenatal Visit  History of Present Illness:   Gloria Alvarado is a 32 y.o. G46P0000 female at [redacted]w[redacted]d with an Estimated Date of Delivery: 07/26/17 being seen today for ongoing management of a low-risk pregnancy. Does have SUA.  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  Contractions: Not present. Vag. Bleeding: None.  Movement: Present. denies leaking of fluid. Review of Systems:   Pertinent items are noted in HPI Denies abnormal vaginal discharge w/ itching/odor/irritation, headaches, visual changes, shortness of breath, chest pain, abdominal pain, severe nausea/vomiting, or problems with urination or bowel movements unless otherwise stated above. Pertinent History Reviewed:  Reviewed past medical,surgical, social, obstetrical and family history.  Reviewed problem list, medications and allergies. Physical Assessment:   Vitals:   03/31/17 0932  BP: 138/70  Pulse: 88  Weight: 197 lb 8 oz (89.6 kg)  Body mass index is 32.87 kg/m.        Physical Examination:   General appearance: Well appearing, and in no distress  Mental status: Alert, oriented to person, place, and time  Skin: Warm & dry  Cardiovascular: Normal heart rate noted  Respiratory: Normal respiratory effort, no distress  Abdomen: Soft, gravid, nontender  Pelvic: Cervical exam deferred         Extremities: Edema: Trace  Fetal Status: Fetal Heart Rate (bpm): 140 Fundal Height: 23 cm Movement: Present    Results for orders placed or performed in visit on 03/31/17 (from the past 24 hour(s))  POCT urinalysis dipstick   Collection Time: 03/31/17  9:33 AM  Result Value Ref Range   Color, UA     Clarity, UA     Glucose, UA neg    Bilirubin, UA     Ketones, UA neg    Spec Grav, UA  1.010 - 1.025   Blood, UA neg    pH, UA  5.0 -  8.0   Protein, UA neg    Urobilinogen, UA  0.2 or 1.0 E.U./dL   Nitrite, UA neg    Leukocytes, UA Small (1+) (A) Negative   Appearance     Odor      Assessment & Plan:  1) Low-risk pregnancy G1P0000 at [redacted]w[redacted]d with an Estimated Date of Delivery: 07/26/17   2) SUA> efw @ 28, 32, 36wks   Meds: No orders of the defined types were placed in this encounter.  Labs/procedures today: none  Plan:  Continue routine obstetrical care   Reviewed: Preterm labor symptoms and general obstetric precautions including but not limited to vaginal bleeding, contractions, leaking of fluid and fetal movement were reviewed in detail with the patient.  All questions were answered  Follow-up: Return in about 4 weeks (around 04/28/2017) for LROB, PN2, US:EFW.  Orders Placed This Encounter  Procedures  . US OB Follow Up  . POCT urinalysis dipstick   Roma Schanz CNM, [facility] 03/31/2017 9:59 AM

## 2017-04-23 ENCOUNTER — Other Ambulatory Visit: Payer: Self-pay | Admitting: Adult Health

## 2017-04-24 ENCOUNTER — Encounter: Payer: Self-pay | Admitting: Women's Health

## 2017-04-28 ENCOUNTER — Ambulatory Visit (INDEPENDENT_AMBULATORY_CARE_PROVIDER_SITE_OTHER): Payer: 59 | Admitting: Obstetrics and Gynecology

## 2017-04-28 ENCOUNTER — Ambulatory Visit (INDEPENDENT_AMBULATORY_CARE_PROVIDER_SITE_OTHER): Payer: 59

## 2017-04-28 ENCOUNTER — Other Ambulatory Visit: Payer: 59

## 2017-04-28 VITALS — BP 124/66 | HR 88 | Wt 205.2 lb

## 2017-04-28 DIAGNOSIS — Z3A27 27 weeks gestation of pregnancy: Secondary | ICD-10-CM

## 2017-04-28 DIAGNOSIS — Z3402 Encounter for supervision of normal first pregnancy, second trimester: Secondary | ICD-10-CM

## 2017-04-28 DIAGNOSIS — O09899 Supervision of other high risk pregnancies, unspecified trimester: Secondary | ICD-10-CM

## 2017-04-28 DIAGNOSIS — Z331 Pregnant state, incidental: Secondary | ICD-10-CM

## 2017-04-28 DIAGNOSIS — Z131 Encounter for screening for diabetes mellitus: Secondary | ICD-10-CM

## 2017-04-28 DIAGNOSIS — Z1389 Encounter for screening for other disorder: Secondary | ICD-10-CM

## 2017-04-28 DIAGNOSIS — O358XX0 Maternal care for other (suspected) fetal abnormality and damage, not applicable or unspecified: Secondary | ICD-10-CM

## 2017-04-28 LAB — POCT URINALYSIS DIPSTICK
Blood, UA: NEGATIVE
GLUCOSE UA: NEGATIVE
Ketones, UA: NEGATIVE
LEUKOCYTES UA: NEGATIVE
Nitrite, UA: NEGATIVE
Protein, UA: NEGATIVE

## 2017-04-28 NOTE — Progress Notes (Signed)
[place] 66+4 wks,cephalic,cx 4 cm,bilat adnexal wnl,posterior pl gr 0,fhr 132 bpm,afi 13 cm

## 2017-04-28 NOTE — Progress Notes (Signed)
   LOW-RISK PREGNANCY VISIT Patient name: Gloria Alvarado MRN [id number]  Date of birth: [birth year] Chief Complaint:   Routine Prenatal Visit (PN2 and ultrasound)  History of Present Illness:   Gloria Alvarado is a 32 y.o. G69P0000 female at [redacted]w[redacted]d with an Estimated Date of Delivery: 07/26/17 being seen today for ongoing management of a low-risk pregnancy.  Prenatal @ labs today  Today she reports no complaints. Contractions: Not present. Vag. Bleeding: None.  Movement: Present. denies leaking of fluid. Review of Systems:   Pertinent items are noted in HPI Denies abnormal vaginal discharge w/ itching/odor/irritation, headaches, visual changes, shortness of breath, chest pain, abdominal pain, severe nausea/vomiting, or problems with urination or bowel movements unless otherwise stated above. Pertinent History Reviewed:  Reviewed past medical,surgical, social, obstetrical and family history.  Reviewed problem list, medications and allergies. Physical Assessment:   Vitals:   04/28/17 0943  BP: 124/66  Pulse: 88  Weight: 205 lb 3.2 oz (93.1 kg)  Body mass index is 34.15 kg/m.        Physical Examination:   General appearance: Well appearing, and in no distress  Mental status: Alert, oriented to person, place, and time  Skin: Warm & dry  Cardiovascular: Normal heart rate noted  Respiratory: Normal respiratory effort, no distress  Abdomen: Soft, gravid, nontender  Pelvic: Cervical exam deferred         Extremities: Edema: Trace  Fetal Status: Fetal Heart Rate (bpm): 132 Fundal Height: 29 cm Movement: Present    Results for orders placed or performed in visit on 04/28/17 (from the past 24 hour(s))  POCT Urinalysis Dipstick   Collection Time: 04/28/17  9:46 AM  Result Value Ref Range   Color, UA     Clarity, UA     Glucose, UA neg    Bilirubin, UA     Ketones, UA neg    Spec Grav, UA  1.010 - 1.025   Blood, UA neg    pH, UA  5.0 - 8.0   Protein, UA neg    Urobilinogen, UA  0.2  or 1.0 E.U./dL   Nitrite, UA neg    Leukocytes, UA Negative Negative   Appearance     Odor      Assessment & Plan:  1) Low-risk pregnancy k, single umbilical artery (2-VC )[id number] at [redacted]w[redacted]d with an Estimated Date of Delivery: 07/26/17    Meds: No orders of the defined types were placed in this encounter.  Labs/procedures today: [place] 44+0 wks,cephalic,cx 4 cm,bilat adnexal wnl,posterior pl gr 0,fhr 132 bpm,afi 13 cm  2-VC confirmed. Plan:  Continue routine obstetrical care , PN2 today  Reviewed: Preterm labor symptoms and general obstetric precautions including but not limited to vaginal bleeding, contractions, leaking of fluid and fetal movement were reviewed in detail with the patient.  All questions were answered  Follow-up: Return in about 2 weeks (around 05/12/2017), or if symptoms worsen or fail to improve, for LROB.  Orders Placed This Encounter  Procedures  . POCT Urinalysis Dipstick     By signing my name below, I, Izna Ahmed, attest that this documentation has been prepared under the direction and in the presence of Jonnie Kind, MD. Electronically Signed: Jabier Gauss, Medical Scribe. 04/28/17. 10:05 AM.  I personally performed the services described in this documentation, which was SCRIBED in my presence. The recorded information has been reviewed and considered accurate. It has been edited as necessary during review. Jonnie Kind, MD

## 2017-04-29 ENCOUNTER — Encounter: Payer: Self-pay | Admitting: Women's Health

## 2017-04-29 LAB — CBC
HEMATOCRIT: 33.9 % — AB (ref 34.0–46.6)
Hemoglobin: 11.6 g/dL (ref 11.1–15.9)
MCH: 32.2 pg (ref 26.6–33.0)
MCHC: 34.2 g/dL (ref 31.5–35.7)
MCV: 94 fL (ref 79–97)
Platelets: 191 10*3/uL (ref 150–379)
RBC: 3.6 x10E6/uL — ABNORMAL LOW (ref 3.77–5.28)
RDW: 13.8 % (ref 12.3–15.4)
WBC: 7 10*3/uL (ref 3.4–10.8)

## 2017-04-29 LAB — ANTIBODY SCREEN: Antibody Screen: NEGATIVE

## 2017-04-29 LAB — GLUCOSE TOLERANCE, 2 HOURS W/ 1HR
GLUCOSE, 2 HOUR: 137 mg/dL (ref 65–152)
Glucose, 1 hour: 145 mg/dL (ref 65–179)
Glucose, Fasting: 81 mg/dL (ref 65–91)

## 2017-04-29 LAB — RPR: RPR Ser Ql: NONREACTIVE

## 2017-04-29 LAB — HIV ANTIBODY (ROUTINE TESTING W REFLEX): HIV SCREEN 4TH GENERATION: NONREACTIVE

## 2017-05-06 ENCOUNTER — Encounter: Payer: Self-pay | Admitting: Women's Health

## 2017-05-08 ENCOUNTER — Ambulatory Visit (HOSPITAL_COMMUNITY)
Admission: RE | Admit: 2017-05-08 | Discharge: 2017-05-08 | Disposition: A | Discharge status: Home / Self Care | Payer: 59 | Source: Ambulatory Visit | Attending: Women's Health | Admitting: Women's Health

## 2017-05-08 ENCOUNTER — Telehealth: Payer: Self-pay | Admitting: Women's Health

## 2017-05-08 ENCOUNTER — Encounter: Payer: Self-pay | Admitting: Women's Health

## 2017-05-08 ENCOUNTER — Ambulatory Visit (INDEPENDENT_AMBULATORY_CARE_PROVIDER_SITE_OTHER): Payer: 59 | Admitting: Women's Health

## 2017-05-08 VITALS — BP 100/60 | HR 99 | Wt 208.0 lb

## 2017-05-08 DIAGNOSIS — O26893 Other specified pregnancy related conditions, third trimester: Secondary | ICD-10-CM | POA: Insufficient documentation

## 2017-05-08 DIAGNOSIS — R0602 Shortness of breath: Secondary | ICD-10-CM | POA: Insufficient documentation

## 2017-05-08 DIAGNOSIS — R079 Chest pain, unspecified: Secondary | ICD-10-CM

## 2017-05-08 DIAGNOSIS — Z3A28 28 weeks gestation of pregnancy: Secondary | ICD-10-CM

## 2017-05-08 DIAGNOSIS — Z331 Pregnant state, incidental: Secondary | ICD-10-CM

## 2017-05-08 DIAGNOSIS — O9989 Other specified diseases and conditions complicating pregnancy, childbirth and the puerperium: Secondary | ICD-10-CM

## 2017-05-08 DIAGNOSIS — Z3402 Encounter for supervision of normal first pregnancy, second trimester: Secondary | ICD-10-CM

## 2017-05-08 DIAGNOSIS — Z1389 Encounter for screening for other disorder: Secondary | ICD-10-CM

## 2017-05-08 LAB — POCT URINALYSIS DIPSTICK
GLUCOSE UA: NEGATIVE
Ketones, UA: NEGATIVE
NITRITE UA: NEGATIVE
PROTEIN UA: NEGATIVE
RBC UA: NEGATIVE

## 2017-05-08 MED ORDER — IOPAMIDOL (ISOVUE-370) INJECTION 76%
100.0000 mL | Freq: Once | INTRAVENOUS | Status: AC | PRN
  Administered 2017-05-08: 100 mL via INTRAVENOUS

## 2017-05-08 NOTE — Patient Instructions (Signed)
Gloria Alvarado, I greatly value your feedback.  If you receive a survey following your visit with [place] today, we appreciate you taking the time to fill it out.  Thanks, Gloria Alvarado, CNM, WHNP-BC   Call the office [phone]) or go to [facility] if:  You begin to have strong, frequent contractions  Your water breaks.  Sometimes it is a big gush of fluid, sometimes it is just a trickle that keeps getting your panties wet or running down your legs  You have vaginal bleeding.  It is normal to have a small amount of spotting if your cervix was checked.   You don't feel your baby moving like normal.  If you don't, get you something to eat and drink and lay down and focus on feeling your baby move.  You should feel at least 10 movements in 2 hours.  If you don't, you should call the office or go to [facility].    Tdap Vaccine  It is recommended that you get the Tdap vaccine during the third trimester of EACH pregnancy to help protect your baby from getting pertussis (whooping cough)  27-36 weeks is the BEST time to do this so that you can pass the protection on to your baby. During pregnancy is better than after pregnancy, but if you are unable to get it during pregnancy it will be offered at the hospital.   You can get this vaccine at the health department or your family doctor  Everyone who will be around your baby should also be up-to-date on their vaccines. Adults (who are not pregnant) only need 1 dose of Tdap during adulthood.   Third Trimester of Pregnancy The third trimester is from week 29 through week 42, months 7 through 9. The third trimester is a time when the fetus is growing rapidly. At the end of the ninth month, the fetus is about 20 inches in length and weighs 6-10 pounds.  BODY CHANGES Your body goes through many changes during pregnancy. The changes vary from woman to woman.   Your weight will continue to increase. You can expect to gain 25-35 pounds (11-16 kg) by  the end of the pregnancy.  You may begin to get stretch marks on your hips, abdomen, and breasts.  You may urinate more often because the fetus is moving lower into your pelvis and pressing on your bladder.  You may develop or continue to have heartburn as a result of your pregnancy.  You may develop constipation because certain hormones are causing the muscles that push waste through your intestines to slow down.  You may develop hemorrhoids or swollen, bulging veins (varicose veins).  You may have pelvic pain because of the weight gain and pregnancy hormones relaxing your joints between the bones in your pelvis. Backaches may result from overexertion of the muscles supporting your posture.  You may have changes in your hair. These can include thickening of your hair, rapid growth, and changes in texture. Some women also have hair loss during or after pregnancy, or hair that feels dry or thin. Your hair will most likely return to normal after your baby is born.  Your breasts will continue to grow and be tender. A yellow discharge may leak from your breasts called colostrum.  Your belly button may stick out.  You may feel short of breath because of your expanding uterus.  You may notice the fetus "dropping," or moving lower in your abdomen.  You may have a bloody  mucus discharge. This usually occurs a few days to a week before labor begins.  Your cervix becomes thin and soft (effaced) near your due date. WHAT TO EXPECT AT YOUR PRENATAL EXAMS  You will have prenatal exams every 2 weeks until week 36. Then, you will have weekly prenatal exams. During a routine prenatal visit:  You will be weighed to make sure you and the fetus are growing normally.  Your blood pressure is taken.  Your abdomen will be measured to track your baby's growth.  The fetal heartbeat will be listened to.  Any test results from the previous visit will be discussed.  You may have a cervical check near your  due date to see if you have effaced. At around 36 weeks, your caregiver will check your cervix. At the same time, your caregiver will also perform a test on the secretions of the vaginal tissue. This test is to determine if a type of bacteria, Group B streptococcus, is present. Your caregiver will explain this further. Your caregiver may ask you:  What your birth plan is.  How you are feeling.  If you are feeling the baby move.  If you have had any abnormal symptoms, such as leaking fluid, bleeding, severe headaches, or abdominal cramping.  If you have any questions. Other tests or screenings that may be performed during your third trimester include:  Blood tests that check for low iron levels (anemia).  Fetal testing to check the health, activity level, and growth of the fetus. Testing is done if you have certain medical conditions or if there are problems during the pregnancy. FALSE LABOR You may feel small, irregular contractions that eventually go away. These are called Braxton Hicks contractions, or false labor. Contractions may last for hours, days, or even weeks before true labor sets in. If contractions come at regular intervals, intensify, or become painful, it is best to be seen by your caregiver.  SIGNS OF LABOR   Menstrual-like cramps.  Contractions that are 5 minutes apart or less.  Contractions that start on the top of the uterus and spread down to the lower abdomen and back.  A sense of increased pelvic pressure or back pain.  A watery or bloody mucus discharge that comes from the vagina. If you have any of these signs before the 37th week of pregnancy, call your caregiver right away. You need to go to the hospital to get checked immediately. HOME CARE INSTRUCTIONS   Avoid all smoking, herbs, alcohol, and unprescribed drugs. These chemicals affect the formation and growth of the baby.  Follow your caregiver's instructions regarding medicine use. There are medicines  that are either safe or unsafe to take during pregnancy.  Exercise only as directed by your caregiver. Experiencing uterine cramps is a good sign to stop exercising.  Continue to eat regular, healthy meals.  Wear a good support bra for breast tenderness.  Do not use hot tubs, steam rooms, or saunas.  Wear your seat belt at all times when driving.  Avoid raw meat, uncooked cheese, cat litter boxes, and soil used by cats. These carry germs that can cause birth defects in the baby.  Take your prenatal vitamins.  Try taking a stool softener (if your caregiver approves) if you develop constipation. Eat more high-fiber foods, such as fresh vegetables or fruit and whole grains. Drink plenty of fluids to keep your urine clear or pale yellow.  Take warm sitz baths to soothe any pain or discomfort caused by hemorrhoids.  Use hemorrhoid cream if your caregiver approves.  If you develop varicose veins, wear support hose. Elevate your feet for 15 minutes, 3-4 times a day. Limit salt in your diet.  Avoid heavy lifting, wear low heal shoes, and practice good posture.  Rest a lot with your legs elevated if you have leg cramps or low back pain.  Visit your dentist if you have not gone during your pregnancy. Use a soft toothbrush to brush your teeth and be gentle when you floss.  A sexual relationship may be continued unless your caregiver directs you otherwise.  Do not travel far distances unless it is absolutely necessary and only with the approval of your caregiver.  Take prenatal classes to understand, practice, and ask questions about the labor and delivery.  Make a trial run to the hospital.  Pack your hospital bag.  Prepare the baby's nursery.  Continue to go to all your prenatal visits as directed by your caregiver. SEEK MEDICAL CARE IF:  You are unsure if you are in labor or if your water has broken.  You have dizziness.  You have mild pelvic cramps, pelvic pressure, or nagging  pain in your abdominal area.  You have persistent nausea, vomiting, or diarrhea.  You have a bad smelling vaginal discharge.  You have pain with urination. SEEK IMMEDIATE MEDICAL CARE IF:   You have a fever.  You are leaking fluid from your vagina.  You have spotting or bleeding from your vagina.  You have severe abdominal cramping or pain.  You have rapid weight loss or gain.  You have shortness of breath with chest pain.  You notice sudden or extreme swelling of your face, hands, ankles, feet, or legs.  You have not felt your baby move in over an hour.  You have severe headaches that do not go away with medicine.  You have vision changes. Document Released: 12/25/2000 Document Revised: 01/05/2013 Document Reviewed: 03/03/2012 [employer] Patient Information 2015 [employer]. This information is not intended to replace advice given to you by your health care provider. Make sure you discuss any questions you have with your health care provider.   

## 2017-05-08 NOTE — Progress Notes (Signed)
Work-in LOW-RISK PREGNANCY VISIT Patient name: Gloria Alvarado MRN [id number]  Date of birth: December 05, 1985 Chief Complaint:   work-in-ob (problem with breathing for several days)  History of Present Illness:   Gloria Alvarado is a 32 y.o. G63P0000 female at [redacted]w[redacted]d with an Estimated Date of Delivery: 07/26/17 being seen today for ongoing management of a low-risk pregnancy.  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  A few weeks ago she started having chest pain radiating from bilateral ribs around to back, worse on Rt- that has eased, still has it occasionally, nothing as bad/consistent as it was when it started. She recently flew to [facility].  Contractions: Irregular.  .  Movement: Present. denies leaking of fluid. Review of Systems:   Pertinent items are noted in HPI Denies abnormal vaginal discharge w/ itching/odor/irritation, headaches, visual changes, shortness of breath, chest pain, abdominal pain, severe nausea/vomiting, or problems with urination or bowel movements unless otherwise stated above. Pertinent History Reviewed:  Reviewed past medical,surgical, social, obstetrical and family history.  Reviewed problem list, medications and allergies. Physical Assessment:   Vitals:   05/08/17 1315  BP: 100/60  Pulse: 99  Weight: 208 lb (94.3 kg)  Body mass index is 34.61 kg/m.   HR 98-103 Pulse ox 97-99% RA        Physical Examination:   General appearance: Well appearing, and in no distress  Mental status: Alert, oriented to person, place, and time  Skin: Warm & dry  Cardiovascular: Normal heart rate noted  Respiratory: Normal respiratory effort, no distress  Abdomen: Soft, gravid, nontender  Pelvic: Cervical exam deferred         Extremities: Edema: Trace  Fetal Status: Fetal Heart Rate (bpm):  142 Fundal Height: 29 cm Movement: Present    Results for orders placed or performed in visit on 05/08/17 (from the past 24 hour(s))  POCT urinalysis dipstick   Collection Time: 05/08/17  1:18 PM  Result Value Ref Range   Color, UA     Clarity, UA     Glucose, UA neg    Bilirubin, UA     Ketones, UA neg    Spec Grav, UA  1.010 - 1.025   Blood, UA neg    pH, UA  5.0 - 8.0   Protein, UA neg    Urobilinogen, UA  0.2 or 1.0 E.U./dL   Nitrite, UA neg    Leukocytes, UA Small (1+) (A) Negative   Appearance     Odor      Assessment & Plan:  1) Low-risk pregnancy G1P0000 at [redacted]w[redacted]d with an Estimated Date of Delivery: 07/26/17   2) Chest pain, sob, recent travel, discussed w/ [facility], although doesn't appear to be in acute distress, hr & pulse ox normal, can't r/o PE, will send for CT chest at [facility]   Meds: No orders of the defined types were placed in this encounter.  Labs/procedures today: to [facility] for Chest CT r/o PE  Plan:  Continue routine obstetrical care   Reviewed: Preterm labor symptoms and general obstetric precautions including but not limited to vaginal bleeding, contractions, leaking of fluid and fetal movement were reviewed in detail with the patient.  All questions were answered  Follow-up: Return for Monday as scheduled for visit w/ [facility].  Orders Placed This  Encounter  Procedures  . CT ANGIO CHEST PE W OR WO CONTRAST  . POCT urinalysis dipstick   Roma Schanz CNM, [facility] 05/08/2017 2:34 PM

## 2017-05-08 NOTE — Telephone Encounter (Signed)
Called pt, notified of neg chest CT, no evidence of PE.  Reviewed warning s/s to report, reasons to seek care.  Roma Schanz, CNM, [facility] 05/08/2017 4:12 PM

## 2017-05-12 ENCOUNTER — Encounter: Payer: Self-pay | Admitting: Obstetrics & Gynecology

## 2017-05-12 ENCOUNTER — Ambulatory Visit (INDEPENDENT_AMBULATORY_CARE_PROVIDER_SITE_OTHER): Payer: 59 | Admitting: Obstetrics & Gynecology

## 2017-05-12 VITALS — BP 98/68 | HR 102 | Wt 207.5 lb

## 2017-05-12 DIAGNOSIS — Z3403 Encounter for supervision of normal first pregnancy, third trimester: Secondary | ICD-10-CM

## 2017-05-12 DIAGNOSIS — Z3A29 29 weeks gestation of pregnancy: Secondary | ICD-10-CM

## 2017-05-12 DIAGNOSIS — Z331 Pregnant state, incidental: Secondary | ICD-10-CM

## 2017-05-12 DIAGNOSIS — Z1389 Encounter for screening for other disorder: Secondary | ICD-10-CM

## 2017-05-12 DIAGNOSIS — O09899 Supervision of other high risk pregnancies, unspecified trimester: Secondary | ICD-10-CM

## 2017-05-12 LAB — POCT URINALYSIS DIPSTICK
GLUCOSE UA: NEGATIVE
Ketones, UA: NEGATIVE
NITRITE UA: NEGATIVE
Protein, UA: NEGATIVE
RBC UA: NEGATIVE

## 2017-05-12 NOTE — Progress Notes (Signed)
G1P0000 [redacted]w[redacted]d Estimated Date of Delivery: 07/26/17  Blood pressure 98/68, pulse (!) 102, weight 207 lb 8 oz (94.1 kg), last menstrual period 10/07/2016.   BP weight and urine results all reviewed and noted.  Please refer to the obstetrical flow sheet for the fundal height and fetal heart rate documentation:  Patient reports good fetal movement, denies any bleeding and no rupture of membranes symptoms or regular contractions. Patient is without complaints. All questions were answered.  Orders Placed This Encounter  Procedures  . US OB Follow Up  . POCT urinalysis dipstick    Plan:  Continued routine obstetrical care,   Return in about 3 weeks (around 06/02/2017) for Growth sonogram, LROB.

## 2017-05-22 IMAGING — MR MR LUMBAR SPINE W/O CM
4 of 5 series · 14 of 48 positions shown · non-contrast
Comparison: None.

CLINICAL DATA: Left lower back pain for 4 months.

EXAM:
MRI LUMBAR SPINE WITHOUT CONTRAST
TECHNIQUE: Multiplanar, multisequence MR imaging of the lumbar spine was
performed. No intravenous contrast was administered.

[Series 3: T2 · sagittal · 4.0mm · 0.75mm/px · 4 of 13 slices shown (1 of 2)]
[im 1/13]
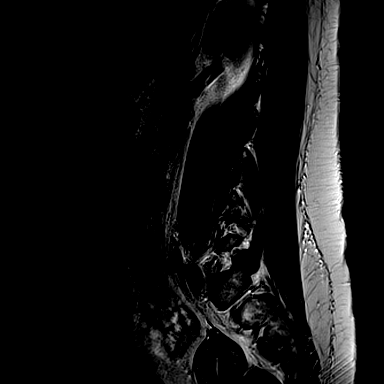
[im 5/13]
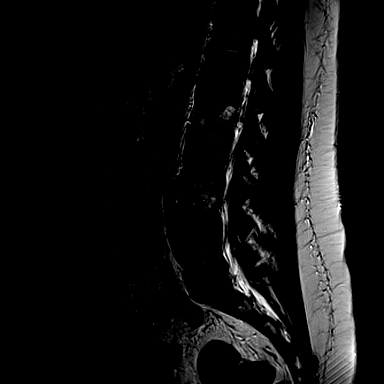
[im 9/13]
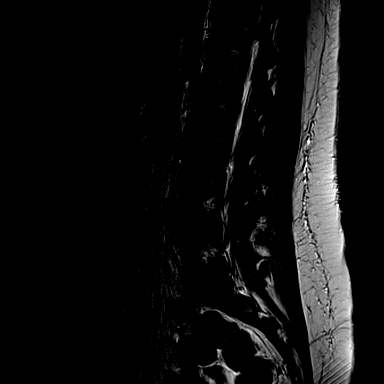
[im 13/13]
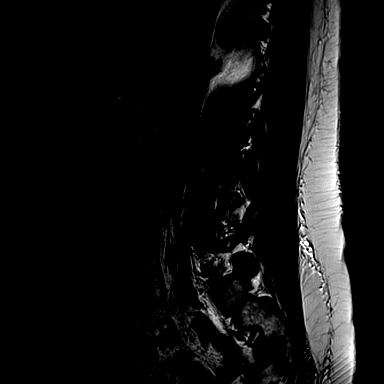

[Series 4: T1 · sagittal · 4.0mm · 0.37mm/px · 3 of 13 slices shown (1 of 2)]
[im 1/13]
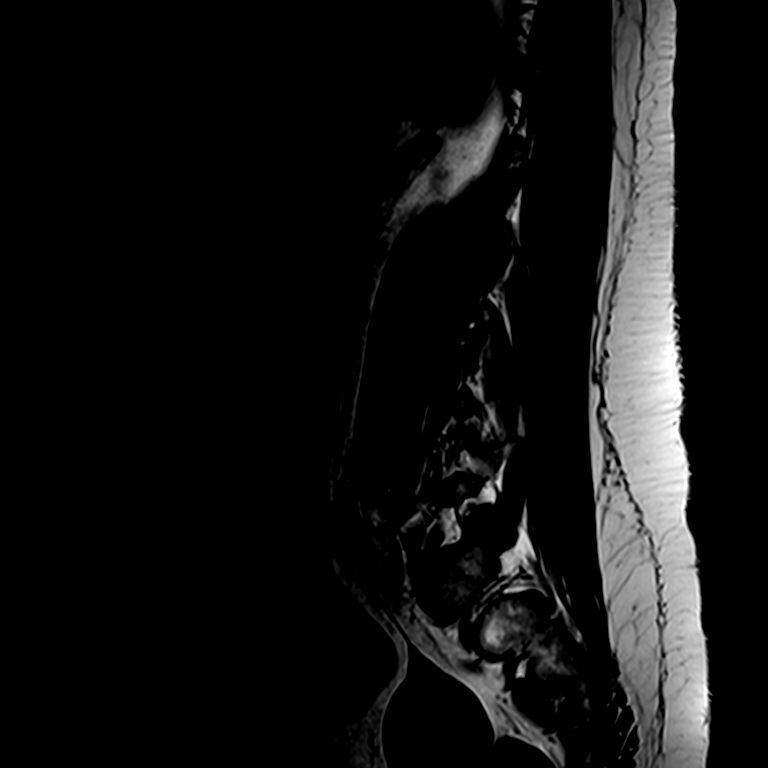
[im 7/13]
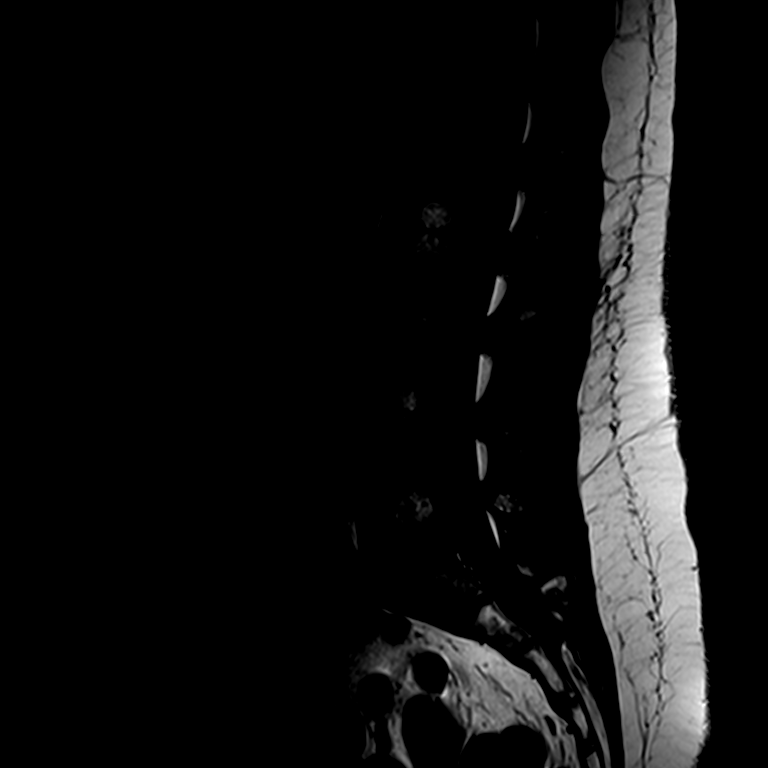
[im 13/13]
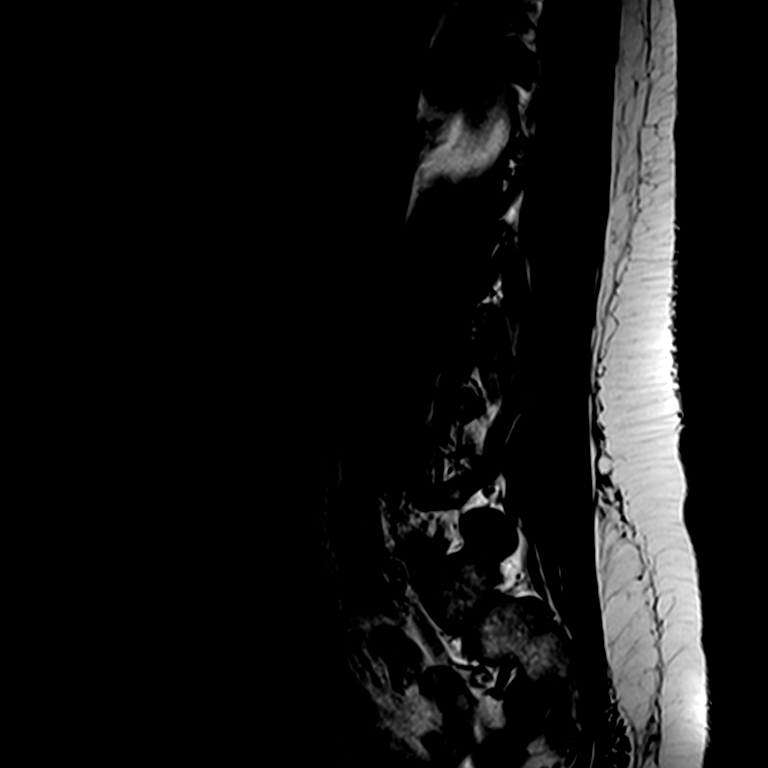

[Series 6: T2 · axial · 4.0mm · 0.23mm/px · z∈[-134,+43]mm · 4 of 43 slices shown (2 of 2)]
[im 3/43]
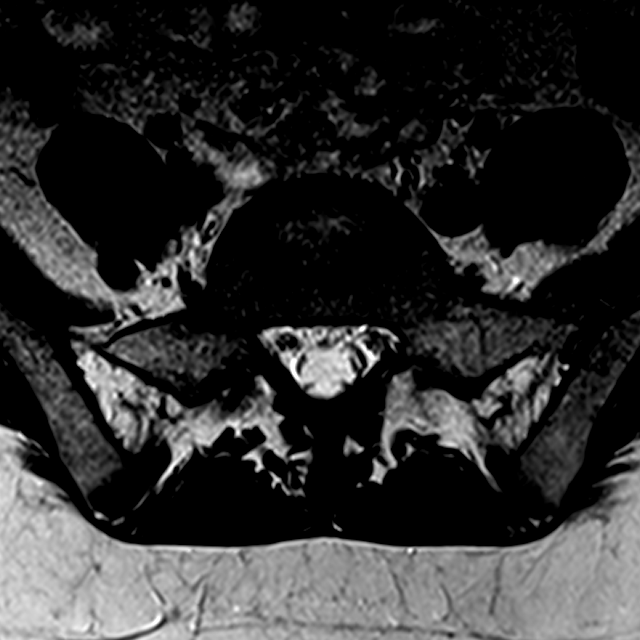
[im 6/43]
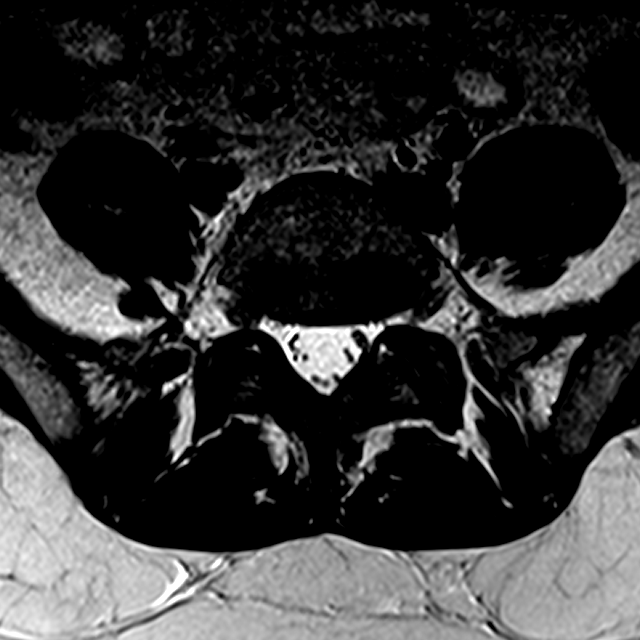
[im 22/43]
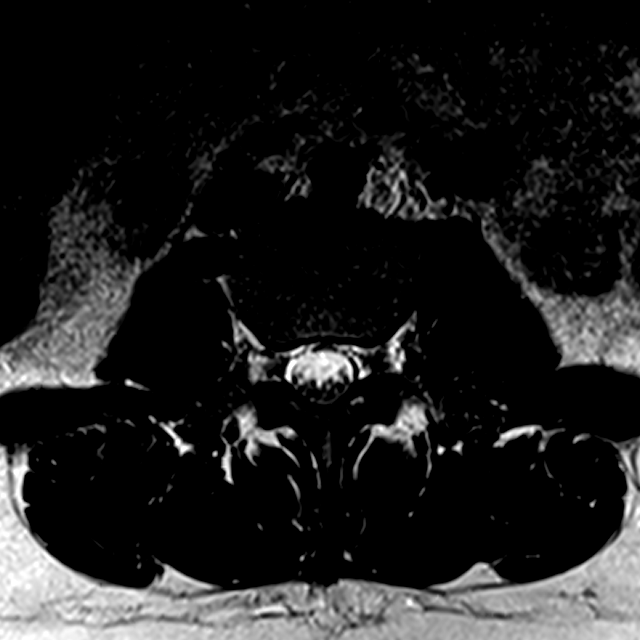
[im 37/43]
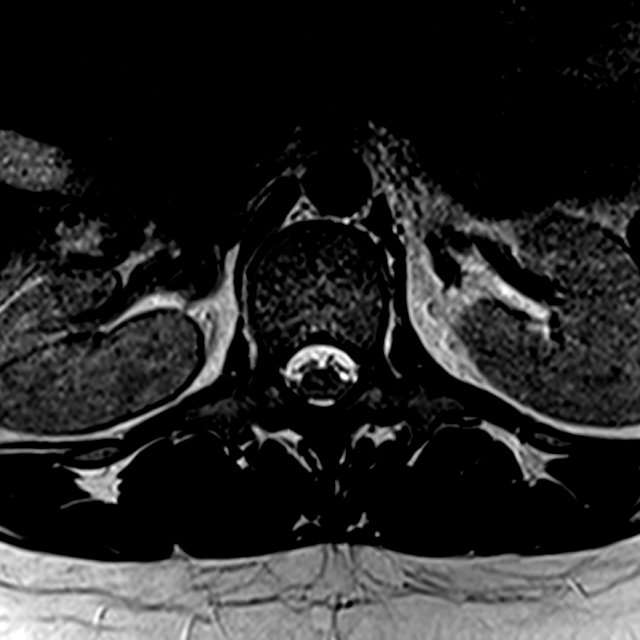

[Series 7: T1 · axial · 4.0mm · 0.23mm/px · z∈[-121,+43]mm · 3 of 43 slices shown (2 of 2)]
[im 6/43]
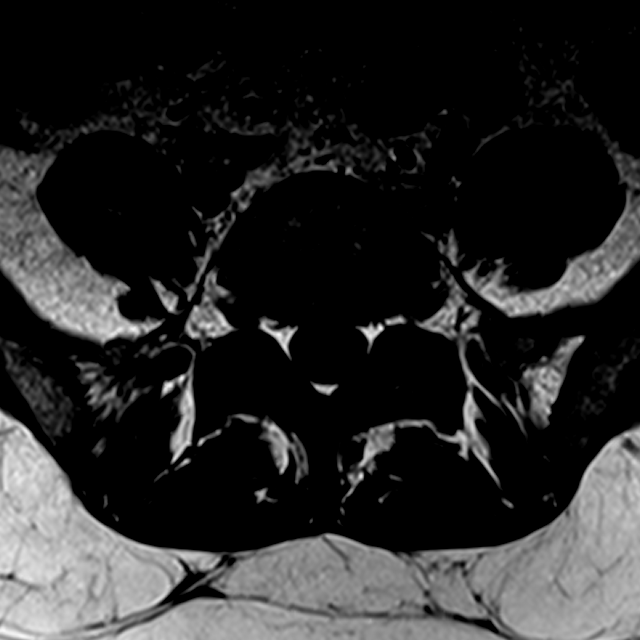
[im 22/43]
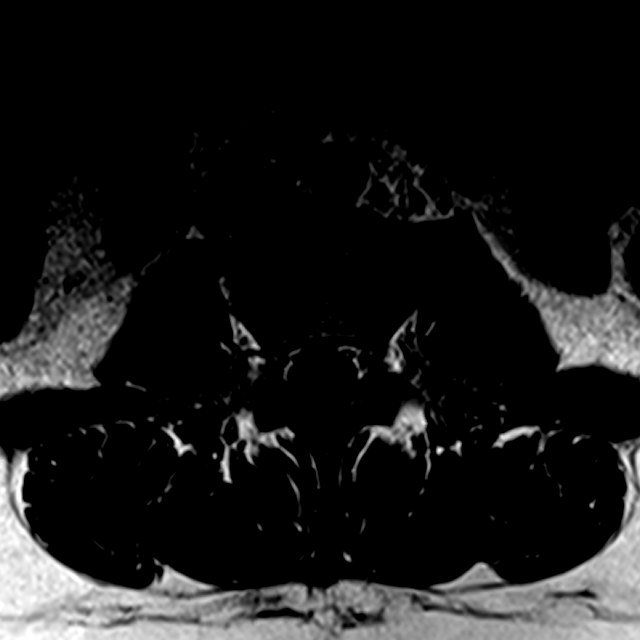
[im 37/43]
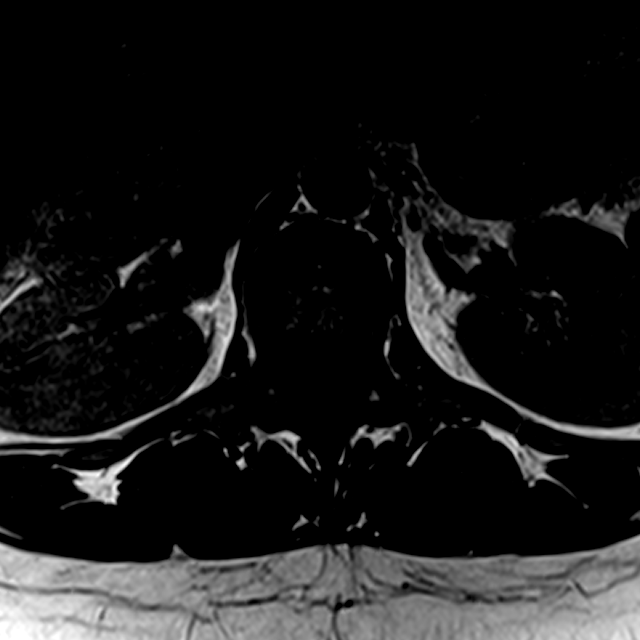

[14 of 48 positions shown; findings below may reference images not displayed]

FINDINGS: Segmentation:  Normal

Alignment:  Normal

Vertebrae: Hemangiomas noted throughout the T12 and lumbar vertebral
bodies. No evidence of aggressive process, fracture, or infection.

Conus medullaris: Extends to the L2 level and appears normal.

Paraspinal and other soft tissues: There is a 9 mm cystic appearing
structure superficial to the L5 left inferior articular process. The
neighboring facets do not appear degenerated and there is no
spondylolysis. No neighboring soft tissue mass is suspected.

Disc levels:

No herniation or impingement.
IMPRESSION: 1. Small varix or 9 mm cyst/ganglion along the left L5 articular
process. No associated facet arthropathy or spondylolysis.
2. No impingement.
3. Multiple benign spinal hemangiomas.

## 2017-06-02 ENCOUNTER — Ambulatory Visit (INDEPENDENT_AMBULATORY_CARE_PROVIDER_SITE_OTHER): Payer: 59 | Admitting: Women's Health

## 2017-06-02 ENCOUNTER — Encounter: Payer: Self-pay | Admitting: Women's Health

## 2017-06-02 ENCOUNTER — Ambulatory Visit (INDEPENDENT_AMBULATORY_CARE_PROVIDER_SITE_OTHER): Payer: 59

## 2017-06-02 VITALS — BP 102/60 | HR 90 | Wt 213.0 lb

## 2017-06-02 DIAGNOSIS — O09893 Supervision of other high risk pregnancies, third trimester: Secondary | ICD-10-CM

## 2017-06-02 DIAGNOSIS — O09899 Supervision of other high risk pregnancies, unspecified trimester: Secondary | ICD-10-CM

## 2017-06-02 DIAGNOSIS — Z1389 Encounter for screening for other disorder: Secondary | ICD-10-CM

## 2017-06-02 DIAGNOSIS — Z3403 Encounter for supervision of normal first pregnancy, third trimester: Secondary | ICD-10-CM

## 2017-06-02 DIAGNOSIS — Z3A32 32 weeks gestation of pregnancy: Secondary | ICD-10-CM | POA: Diagnosis not present

## 2017-06-02 DIAGNOSIS — Z23 Encounter for immunization: Secondary | ICD-10-CM | POA: Diagnosis not present

## 2017-06-02 DIAGNOSIS — Z331 Pregnant state, incidental: Secondary | ICD-10-CM

## 2017-06-02 LAB — POCT URINALYSIS DIPSTICK
Blood, UA: NEGATIVE
GLUCOSE UA: NEGATIVE
KETONES UA: NEGATIVE
Leukocytes, UA: NEGATIVE
Nitrite, UA: NEGATIVE
Protein, UA: NEGATIVE

## 2017-06-02 MED ORDER — TETANUS-DIPHTH-ACELL PERTUSSIS 5-2.5-18.5 LF-MCG/0.5 IM SUSP
0.5000 mL | Freq: Once | INTRAMUSCULAR | Status: AC
  Administered 2017-06-02: 0.5 mL via INTRAMUSCULAR

## 2017-06-02 NOTE — Progress Notes (Signed)
   LOW-RISK PREGNANCY VISIT Patient name: Gloria Alvarado MRN [id number]  Date of birth: Jun 13, 1985 Chief Complaint:   Routine Prenatal Visit  History of Present Illness:   Gloria Alvarado is a 32 y.o. G28P0000 female at [redacted]w[redacted]d with an Estimated Date of Delivery: 07/26/17 being seen today for ongoing management of a low-risk pregnancy. SUA.  Today she reports no complaints. Contractions: Not present. Vag. Bleeding: None.  Movement: Present. denies leaking of fluid. Review of Systems:   Pertinent items are noted in HPI Denies abnormal vaginal discharge w/ itching/odor/irritation, headaches, visual changes, shortness of breath, chest pain, abdominal pain, severe nausea/vomiting, or problems with urination or bowel movements unless otherwise stated above. Pertinent History Reviewed:  Reviewed past medical,surgical, social, obstetrical and family history.  Reviewed problem list, medications and allergies. Physical Assessment:   Vitals:   06/02/17 1008  BP: 102/60  Pulse: 90  Weight: 213 lb (96.6 kg)  Body mass index is 35.45 kg/m.        Physical Examination:   General appearance: Well appearing, and in no distress  Mental status: Alert, oriented to person, place, and time  Skin: Warm & dry  Cardiovascular: Normal heart rate noted  Respiratory: Normal respiratory effort, no distress  Abdomen: Soft, gravid, nontender  Pelvic: Cervical exam deferred         Extremities: Edema: Trace  Fetal Status: Fetal Heart Rate (bpm): 137 u/s Fundal Height: 32 cm Movement: Present    [place] 70+3 wks,cephalic,posterior pl gr 0,AFI 13 cm,normal ovaries bilat,fhr 137 bpm,2VC,EFW 2213 g 77%  Results for orders placed or performed in visit on 06/02/17 (from the past 24 hour(s))  POCT Urinalysis Dipstick   Collection Time: 06/02/17 10:16 AM  Result Value Ref Range   Color, UA     Clarity, UA     Glucose, UA neg    Bilirubin, UA     Ketones, UA neg    Spec Grav, UA  1.010 - 1.025   Blood, UA neg    pH,  UA  5.0 - 8.0   Protein, UA neg    Urobilinogen, UA  0.2 or 1.0 E.U./dL   Nitrite, UA neg    Leukocytes, UA Negative Negative   Appearance     Odor      Assessment & Plan:  1) Low-risk pregnancy G1P0000 at [redacted]w[redacted]d with an Estimated Date of Delivery: 07/26/17   2) SUA, stable, efw 77% today, repeat at 36wks   Meds:  Meds ordered this encounter  Medications  . Tdap (BOOSTRIX) injection 0.5 mL   Labs/procedures today: u/s, tdap  Plan:  Continue routine obstetrical care   Reviewed: Preterm labor symptoms and general obstetric precautions including but not limited to vaginal bleeding, contractions, leaking of fluid and fetal movement were reviewed in detail with the patient.  All questions were answered  Follow-up: Return in about 2 weeks (around 06/16/2017) for [place].  Orders Placed This Encounter  Procedures  . POCT Urinalysis Dipstick   Roma Schanz CNM, [facility] 06/02/2017 10:38 AM

## 2017-06-02 NOTE — Patient Instructions (Signed)
Gloria Alvarado, I greatly value your feedback.  If you receive a survey following your visit with [place] today, we appreciate you taking the time to fill it out.  Thanks, Gloria Alvarado, CNM, WHNP-BC   Call the office [phone]) or go to [facility] if:  You begin to have strong, frequent contractions  Your water breaks.  Sometimes it is a big gush of fluid, sometimes it is just a trickle that keeps getting your panties wet or running down your legs  You have vaginal bleeding.  It is normal to have a small amount of spotting if your cervix was checked.   You don't feel your baby moving like normal.  If you don't, get you something to eat and drink and lay down and focus on feeling your baby move.  You should feel at least 10 movements in 2 hours.  If you don't, you should call the office or go to [facility].    Tdap Vaccine  It is recommended that you get the Tdap vaccine during the third trimester of EACH pregnancy to help protect your baby from getting pertussis (whooping cough)  27-36 weeks is the BEST time to do this so that you can pass the protection on to your baby. During pregnancy is better than after pregnancy, but if you are unable to get it during pregnancy it will be offered at the hospital.   You can get this vaccine at the health department or your family doctor  Everyone who will be around your baby should also be up-to-date on their vaccines. Adults (who are not pregnant) only need 1 dose of Tdap during adulthood.   Third Trimester of Pregnancy The third trimester is from week 29 through week 42, months 7 through 9. The third trimester is a time when the fetus is growing rapidly. At the end of the ninth month, the fetus is about 20 inches in length and weighs 6-10 pounds.  BODY CHANGES Your body goes through many changes during pregnancy. The changes vary from woman to woman.   Your weight will continue to increase. You can expect to gain 25-35 pounds (11-16 kg) by  the end of the pregnancy.  You may begin to get stretch marks on your hips, abdomen, and breasts.  You may urinate more often because the fetus is moving lower into your pelvis and pressing on your bladder.  You may develop or continue to have heartburn as a result of your pregnancy.  You may develop constipation because certain hormones are causing the muscles that push waste through your intestines to slow down.  You may develop hemorrhoids or swollen, bulging veins (varicose veins).  You may have pelvic pain because of the weight gain and pregnancy hormones relaxing your joints between the bones in your pelvis. Backaches may result from overexertion of the muscles supporting your posture.  You may have changes in your hair. These can include thickening of your hair, rapid growth, and changes in texture. Some women also have hair loss during or after pregnancy, or hair that feels dry or thin. Your hair will most likely return to normal after your baby is born.  Your breasts will continue to grow and be tender. A yellow discharge may leak from your breasts called colostrum.  Your belly button may stick out.  You may feel short of breath because of your expanding uterus.  You may notice the fetus "dropping," or moving lower in your abdomen.  You may have a bloody  mucus discharge. This usually occurs a few days to a week before labor begins.  Your cervix becomes thin and soft (effaced) near your due date. WHAT TO EXPECT AT YOUR PRENATAL EXAMS  You will have prenatal exams every 2 weeks until week 36. Then, you will have weekly prenatal exams. During a routine prenatal visit:  You will be weighed to make sure you and the fetus are growing normally.  Your blood pressure is taken.  Your abdomen will be measured to track your baby's growth.  The fetal heartbeat will be listened to.  Any test results from the previous visit will be discussed.  You may have a cervical check near your  due date to see if you have effaced. At around 36 weeks, your caregiver will check your cervix. At the same time, your caregiver will also perform a test on the secretions of the vaginal tissue. This test is to determine if a type of bacteria, Group B streptococcus, is present. Your caregiver will explain this further. Your caregiver may ask you:  What your birth plan is.  How you are feeling.  If you are feeling the baby move.  If you have had any abnormal symptoms, such as leaking fluid, bleeding, severe headaches, or abdominal cramping.  If you have any questions. Other tests or screenings that may be performed during your third trimester include:  Blood tests that check for low iron levels (anemia).  Fetal testing to check the health, activity level, and growth of the fetus. Testing is done if you have certain medical conditions or if there are problems during the pregnancy. FALSE LABOR You may feel small, irregular contractions that eventually go away. These are called Braxton Hicks contractions, or false labor. Contractions may last for hours, days, or even weeks before true labor sets in. If contractions come at regular intervals, intensify, or become painful, it is best to be seen by your caregiver.  SIGNS OF LABOR   Menstrual-like cramps.  Contractions that are 5 minutes apart or less.  Contractions that start on the top of the uterus and spread down to the lower abdomen and back.  A sense of increased pelvic pressure or back pain.  A watery or bloody mucus discharge that comes from the vagina. If you have any of these signs before the 37th week of pregnancy, call your caregiver right away. You need to go to the hospital to get checked immediately. HOME CARE INSTRUCTIONS   Avoid all smoking, herbs, alcohol, and unprescribed drugs. These chemicals affect the formation and growth of the baby.  Follow your caregiver's instructions regarding medicine use. There are medicines  that are either safe or unsafe to take during pregnancy.  Exercise only as directed by your caregiver. Experiencing uterine cramps is a good sign to stop exercising.  Continue to eat regular, healthy meals.  Wear a good support bra for breast tenderness.  Do not use hot tubs, steam rooms, or saunas.  Wear your seat belt at all times when driving.  Avoid raw meat, uncooked cheese, cat litter boxes, and soil used by cats. These carry germs that can cause birth defects in the baby.  Take your prenatal vitamins.  Try taking a stool softener (if your caregiver approves) if you develop constipation. Eat more high-fiber foods, such as fresh vegetables or fruit and whole grains. Drink plenty of fluids to keep your urine clear or pale yellow.  Take warm sitz baths to soothe any pain or discomfort caused by hemorrhoids.  Use hemorrhoid cream if your caregiver approves.  If you develop varicose veins, wear support hose. Elevate your feet for 15 minutes, 3-4 times a day. Limit salt in your diet.  Avoid heavy lifting, wear low heal shoes, and practice good posture.  Rest a lot with your legs elevated if you have leg cramps or low back pain.  Visit your dentist if you have not gone during your pregnancy. Use a soft toothbrush to brush your teeth and be gentle when you floss.  A sexual relationship may be continued unless your caregiver directs you otherwise.  Do not travel far distances unless it is absolutely necessary and only with the approval of your caregiver.  Take prenatal classes to understand, practice, and ask questions about the labor and delivery.  Make a trial run to the hospital.  Pack your hospital bag.  Prepare the baby's nursery.  Continue to go to all your prenatal visits as directed by your caregiver. SEEK MEDICAL CARE IF:  You are unsure if you are in labor or if your water has broken.  You have dizziness.  You have mild pelvic cramps, pelvic pressure, or nagging  pain in your abdominal area.  You have persistent nausea, vomiting, or diarrhea.  You have a bad smelling vaginal discharge.  You have pain with urination. SEEK IMMEDIATE MEDICAL CARE IF:   You have a fever.  You are leaking fluid from your vagina.  You have spotting or bleeding from your vagina.  You have severe abdominal cramping or pain.  You have rapid weight loss or gain.  You have shortness of breath with chest pain.  You notice sudden or extreme swelling of your face, hands, ankles, feet, or legs.  You have not felt your baby move in over an hour.  You have severe headaches that do not go away with medicine.  You have vision changes. Document Released: 12/25/2000 Document Revised: 01/05/2013 Document Reviewed: 03/03/2012 Exit[employer] Patient Information 2015 [employer]. This information is not intended to replace advice given to you by your health care provider. Make sure you discuss any questions you have with your health care provider.   

## 2017-06-02 NOTE — Progress Notes (Signed)
[place] 28+7 wks,cephalic,posterior pl gr 0,AFI 13 cm,normal ovaries bilat,fhr 137 bpm,2VC,EFW 2213 g 77%

## 2017-06-16 ENCOUNTER — Ambulatory Visit (INDEPENDENT_AMBULATORY_CARE_PROVIDER_SITE_OTHER): Payer: 59 | Admitting: Obstetrics and Gynecology

## 2017-06-16 VITALS — BP 102/64 | HR 90 | Wt 213.4 lb

## 2017-06-16 DIAGNOSIS — Z1389 Encounter for screening for other disorder: Secondary | ICD-10-CM

## 2017-06-16 DIAGNOSIS — O368330 Maternal care for abnormalities of the fetal heart rate or rhythm, third trimester, not applicable or unspecified: Secondary | ICD-10-CM

## 2017-06-16 DIAGNOSIS — Z3A34 34 weeks gestation of pregnancy: Secondary | ICD-10-CM | POA: Diagnosis not present

## 2017-06-16 DIAGNOSIS — Z331 Pregnant state, incidental: Secondary | ICD-10-CM

## 2017-06-16 DIAGNOSIS — Z3403 Encounter for supervision of normal first pregnancy, third trimester: Secondary | ICD-10-CM

## 2017-06-16 LAB — POCT URINALYSIS DIPSTICK
GLUCOSE UA: POSITIVE — AB
KETONES UA: NEGATIVE
Leukocytes, UA: NEGATIVE
Nitrite, UA: NEGATIVE
Protein, UA: NEGATIVE
RBC UA: NEGATIVE

## 2017-06-16 NOTE — Progress Notes (Signed)
   LOW-RISK PREGNANCY VISIT Patient name: Gloria Alvarado MRN [id number]  Date of birth: 06-18-1985 Chief Complaint:   Routine Prenatal Visit  History of Present Illness:   Gloria Alvarado is a 32 y.o. G40P0000 female at [redacted]w[redacted]d with an Estimated Date of Delivery: 07/26/17 being seen today for ongoing management of a low-risk pregnancy. This is pt first child. Today she reports no complaints. Contractions: Not present. Vag. Bleeding: None.  Movement: Present. denies leaking of fluid. Review of Systems:   Pertinent items are noted in HPI Denies abnormal vaginal discharge w/ itching/odor/irritation, headaches, visual changes, shortness of breath, chest pain, abdominal pain, severe nausea/vomiting, or problems with urination or bowel movements unless otherwise stated above. Pertinent History Reviewed:  Reviewed past medical,surgical, social, obstetrical and family history.  Reviewed problem list, medications and allergies. Physical Assessment:   Vitals:   06/16/17 1036  BP: 102/64  Pulse: 90  Weight: 213 lb 6.4 oz (96.8 kg)  Body mass index is 35.51 kg/m.        Physical Examination:   General appearance: Well appearing, and in no distress  Mental status: Alert, oriented to person, place, and time  Skin: Warm & dry  Cardiovascular: Normal heart rate noted  Respiratory: Normal respiratory effort, no distress  Abdomen: Soft, gravid, nontender, fetal movement   Pelvic: Cervical exam deferred         Extremities: Edema: Trace  Fetal Status: Fetal Heart Rate (bpm): 189 doppler Fundal Height: 35 cm Movement: Present    Results for orders placed or performed in visit on 06/16/17 (from the past 24 hour(s))  POCT Urinalysis Dipstick   Collection Time: 06/16/17 10:38 AM  Result Value Ref Range   Color, UA     Clarity, UA     Glucose, UA Positive (A) Negative   Bilirubin, UA     Ketones, UA neg    Spec Grav, UA  1.010 - 1.025   Blood, UA neg    pH, UA  5.0 - 8.0   Protein, UA Negative  Negative   Urobilinogen, UA  0.2 or 1.0 E.U./dL   Nitrite, UA neg    Leukocytes, UA Negative Negative   Appearance     Odor      Assessment & Plan:  1) Low-risk pregnancy G1P0000 at [redacted]w[redacted]d with an Estimated Date of Delivery: 07/26/17    Meds: No orders of the defined types were placed in this encounter.  Labs/procedures today: NST due to fetal tachycardia NST reactive with FHR in 140's  Plan:  Continue routine obstetrical care  Follow-up: Return in about 2 weeks (around 06/30/2017).  Orders Placed This Encounter  Procedures  . POCT Urinalysis Dipstick   By signing my name below, I, Samul Dada, attest that this documentation has been prepared under the direction and in the presence of Jonnie Kind, MD. Electronically Signed: [place]. 06/16/17. 11:05 AM.  I personally performed the services described in this documentation, which was SCRIBED in my presence. The recorded information has been reviewed and considered accurate. It has been edited as necessary during review. Jonnie Kind, MD

## 2017-06-30 ENCOUNTER — Ambulatory Visit (INDEPENDENT_AMBULATORY_CARE_PROVIDER_SITE_OTHER): Payer: 59 | Admitting: Women's Health

## 2017-06-30 ENCOUNTER — Encounter: Payer: Self-pay | Admitting: Women's Health

## 2017-06-30 VITALS — BP 111/62 | HR 89 | Wt 217.5 lb

## 2017-06-30 DIAGNOSIS — Z1389 Encounter for screening for other disorder: Secondary | ICD-10-CM

## 2017-06-30 DIAGNOSIS — Z3403 Encounter for supervision of normal first pregnancy, third trimester: Secondary | ICD-10-CM

## 2017-06-30 DIAGNOSIS — Z3A36 36 weeks gestation of pregnancy: Secondary | ICD-10-CM

## 2017-06-30 DIAGNOSIS — Z331 Pregnant state, incidental: Secondary | ICD-10-CM

## 2017-06-30 DIAGNOSIS — O9989 Other specified diseases and conditions complicating pregnancy, childbirth and the puerperium: Secondary | ICD-10-CM

## 2017-06-30 DIAGNOSIS — O358XX0 Maternal care for other (suspected) fetal abnormality and damage, not applicable or unspecified: Secondary | ICD-10-CM

## 2017-06-30 DIAGNOSIS — R1032 Left lower quadrant pain: Secondary | ICD-10-CM

## 2017-06-30 DIAGNOSIS — O09899 Supervision of other high risk pregnancies, unspecified trimester: Secondary | ICD-10-CM

## 2017-06-30 LAB — POCT URINALYSIS DIPSTICK
Glucose, UA: NEGATIVE
Ketones, UA: NEGATIVE
Nitrite, UA: NEGATIVE
Protein, UA: NEGATIVE
RBC UA: NEGATIVE

## 2017-06-30 NOTE — Patient Instructions (Signed)
Gloria Alvarado, I greatly value your feedback.  If you receive a survey following your visit with [place] today, we appreciate you taking the time to fill it out.  Thanks, Gloria Alvarado, CNM, WHNP-BC   Call the office [phone]) or go to [facility] if:  You begin to have strong, frequent contractions  Your water breaks.  Sometimes it is a big gush of fluid, sometimes it is just a trickle that keeps getting your panties wet or running down your legs  You have vaginal bleeding.  It is normal to have a small amount of spotting if your cervix was checked.   You don't feel your baby moving like normal.  If you don't, get you something to eat and drink and lay down and focus on feeling your baby move.  You should feel at least 10 movements in 2 hours.  If you don't, you should call the office or go to [place] and Birth Information The normal length of a pregnancy is 39-41 weeks. Preterm labor is when labor starts before 37 completed weeks of pregnancy. What are the risk factors for preterm labor? Preterm labor is more likely to occur in women who:  Have certain infections during pregnancy such as a bladder infection, sexually transmitted infection, or infection inside the uterus (chorioamnionitis).  Have a shorter-than-normal cervix.  Have gone into preterm labor before.  Have had surgery on their cervix.  Are younger than age 68 or older than age 21.  Are African American.  Are pregnant with twins or multiple babies (multiple gestation).  Take street drugs or smoke while pregnant.  Do not gain enough weight while pregnant.  Became pregnant shortly after having been pregnant.  What are the symptoms of preterm labor? Symptoms of preterm labor include:  Cramps similar to those that can happen during a menstrual period. The cramps may happen with diarrhea.  Pain in the abdomen or lower back.  Regular uterine contractions that may feel like tightening  of the abdomen.  A feeling of increased pressure in the pelvis.  Increased watery or bloody mucus discharge from the vagina.  Water breaking (ruptured amniotic sac).  Why is it important to recognize signs of preterm labor? It is important to recognize signs of preterm labor because babies who are born prematurely may not be fully developed. This can put them at an increased risk for:  Long-term (chronic) heart and lung problems.  Difficulty immediately after birth with regulating body systems, including blood sugar, body temperature, heart rate, and breathing rate.  Bleeding in the brain.  Cerebral palsy.  Learning difficulties.  Death.  These risks are highest for babies who are born before 30 weeks of pregnancy. How is preterm labor treated? Treatment depends on the length of your pregnancy, your condition, and the health of your baby. It may involve:  Having a stitch (suture) placed in your cervix to prevent your cervix from opening too early (cerclage).  Taking or being given medicines, such as: ? Hormone medicines. These may be given early in pregnancy to help support the pregnancy. ? Medicine to stop contractions. ? Medicines to help mature the baby's lungs. These may be prescribed if the risk of delivery is high. ? Medicines to prevent your baby from developing cerebral palsy.  If the labor happens before 34 weeks of pregnancy, you may need to stay in the hospital. What should I do if I think I am in preterm labor? If  you think that you are going into preterm labor, call your health care provider right away. How can I prevent preterm labor in future pregnancies? To increase your chance of having a full-term pregnancy:  Do not use any tobacco products, such as cigarettes, chewing tobacco, and e-cigarettes. If you need help quitting, ask your health care provider.  Do not use street drugs or medicines that have not been prescribed to you during your pregnancy.  Talk  with your health care provider before taking any herbal supplements, even if you have been taking them regularly.  Make sure you gain a healthy amount of weight during your pregnancy.  Watch for infection. If you think that you might have an infection, get it checked right away.  Make sure to tell your health care provider if you have gone into preterm labor before.  This information is not intended to replace advice given to you by your health care provider. Make sure you discuss any questions you have with your health care provider. Document Released: 03/23/2003 Document Revised: 06/13/2015 Document Reviewed: 05/24/2015 Elsevier Interactive Patient Education  2018 [employer].

## 2017-06-30 NOTE — Progress Notes (Signed)
   LOW-RISK PREGNANCY VISIT Patient name: Gloria Alvarado MRN [id number]  Date of birth: 1985/06/09 Chief Complaint:   Routine Prenatal Visit (sharp pain in lower left side off and on)  History of Present Illness:   Gloria Alvarado is a 32 y.o. G19P0000 female at [redacted]w[redacted]d with an Estimated Date of Delivery: 07/26/17 being seen today for ongoing management of a low-risk pregnancy.  Today she reports occ sharp pain LLQ, normal bm's, doesn't feel exactly like RLP. Contractions: Irregular. Vag. Bleeding: None.  Movement: Present. denies leaking of fluid. Review of Systems:   Pertinent items are noted in HPI Denies abnormal vaginal discharge w/ itching/odor/irritation, headaches, visual changes, shortness of breath, chest pain, abdominal pain, severe nausea/vomiting, or problems with urination or bowel movements unless otherwise stated above. Pertinent History Reviewed:  Reviewed past medical,surgical, social, obstetrical and family history.  Reviewed problem list, medications and allergies. Physical Assessment:   Vitals:   06/30/17 0933  BP: 111/62  Pulse: 89  Weight: 217 lb 8 oz (98.7 kg)  Body mass index is 36.19 kg/m.        Physical Examination:   General appearance: Well appearing, and in no distress  Mental status: Alert, oriented to person, place, and time  Skin: Warm & dry  Cardiovascular: Normal heart rate noted  Respiratory: Normal respiratory effort, no distress  Abdomen: Soft, gravid, nontender  Pelvic: Cervical exam deferred         Extremities: Edema: Trace  Fetal Status: Fetal Heart Rate (bpm): 155 Fundal Height: 36 cm Movement: Present Presentation: Vertex by Leopold's  Results for orders placed or performed in visit on 06/30/17 (from the past 24 hour(s))  POCT urinalysis dipstick   Collection Time: 06/30/17  9:34 AM  Result Value Ref Range   Color, UA     Clarity, UA     Glucose, UA Negative Negative   Bilirubin, UA     Ketones, UA neg    Spec Grav, UA  1.010 -  1.025   Blood, UA neg    pH, UA  5.0 - 8.0   Protein, UA Negative Negative   Urobilinogen, UA  0.2 or 1.0 E.U./dL   Nitrite, UA neg    Leukocytes, UA Moderate (2+) (A) Negative   Appearance     Odor      Assessment & Plan:  1) Low-risk pregnancy G1P0000 at [redacted]w[redacted]d with an Estimated Date of Delivery: 07/26/17   2) SUA, efw u/s this week   Meds: No orders of the defined types were placed in this encounter.  Labs/procedures today: none  Plan:  Continue routine obstetrical care   Reviewed: Preterm labor symptoms and general obstetric precautions including but not limited to vaginal bleeding, contractions, leaking of fluid and fetal movement were reviewed in detail with the patient.  All questions were answered  Follow-up: Return for Wed @ 1 for efw u/s (no visit), then 1wk for LROB. and gbs  Orders Placed This Encounter  Procedures  . US OB Follow Up  . POCT urinalysis dipstick   Roma Schanz CNM, [facility] 06/30/2017 9:57 AM

## 2017-07-02 ENCOUNTER — Encounter: Payer: Self-pay | Admitting: Women's Health

## 2017-07-02 ENCOUNTER — Ambulatory Visit (INDEPENDENT_AMBULATORY_CARE_PROVIDER_SITE_OTHER): Payer: 59

## 2017-07-02 DIAGNOSIS — O09899 Supervision of other high risk pregnancies, unspecified trimester: Secondary | ICD-10-CM

## 2017-07-02 DIAGNOSIS — O358XX0 Maternal care for other (suspected) fetal abnormality and damage, not applicable or unspecified: Secondary | ICD-10-CM

## 2017-07-02 DIAGNOSIS — Z3A36 36 weeks gestation of pregnancy: Secondary | ICD-10-CM

## 2017-07-02 DIAGNOSIS — Z3403 Encounter for supervision of normal first pregnancy, third trimester: Secondary | ICD-10-CM

## 2017-07-02 NOTE — Progress Notes (Signed)
[place] 22+7 wks,cephalic,fhr 737 HGR,1WB,DGREUXBPQ pl gr 1,AFI 17 cm,EFW 2442 g 90 %

## 2017-07-08 ENCOUNTER — Encounter: Payer: Self-pay | Admitting: Women's Health

## 2017-07-08 ENCOUNTER — Ambulatory Visit (INDEPENDENT_AMBULATORY_CARE_PROVIDER_SITE_OTHER): Payer: 59 | Admitting: Women's Health

## 2017-07-08 VITALS — BP 116/64 | HR 91 | Wt 219.4 lb

## 2017-07-08 DIAGNOSIS — O26893 Other specified pregnancy related conditions, third trimester: Secondary | ICD-10-CM

## 2017-07-08 DIAGNOSIS — Z3A37 37 weeks gestation of pregnancy: Secondary | ICD-10-CM

## 2017-07-08 DIAGNOSIS — O09893 Supervision of other high risk pregnancies, third trimester: Secondary | ICD-10-CM

## 2017-07-08 DIAGNOSIS — Z3403 Encounter for supervision of normal first pregnancy, third trimester: Secondary | ICD-10-CM

## 2017-07-08 DIAGNOSIS — Z1389 Encounter for screening for other disorder: Secondary | ICD-10-CM

## 2017-07-08 DIAGNOSIS — O09899 Supervision of other high risk pregnancies, unspecified trimester: Secondary | ICD-10-CM

## 2017-07-08 DIAGNOSIS — Z331 Pregnant state, incidental: Secondary | ICD-10-CM

## 2017-07-08 LAB — POCT URINALYSIS DIPSTICK
GLUCOSE UA: NEGATIVE
KETONES UA: NEGATIVE
Nitrite, UA: NEGATIVE
Protein, UA: NEGATIVE
RBC UA: NEGATIVE

## 2017-07-08 LAB — OB RESULTS CONSOLE GBS: STREP GROUP B AG: POSITIVE

## 2017-07-08 NOTE — Patient Instructions (Signed)
Gloria Alvarado, I greatly value your feedback.  If you receive a survey following your visit with [place] today, we appreciate you taking the time to fill it out.  Thanks, Knute Neu, CNM, WHNP-BC   Call the office [phone]) or go to [facility] if:  You begin to have strong, frequent contractions  Your water breaks.  Sometimes it is a big gush of fluid, sometimes it is just a trickle that keeps getting your panties wet or running down your legs  You have vaginal bleeding.  It is normal to have a small amount of spotting if your cervix was checked.   You don't feel your baby moving like normal.  If you don't, get you something to eat and drink and lay down and focus on feeling your baby move.  You should feel at least 10 movements in 2 hours.  If you don't, you should call the office or go to [place] Contractions Contractions of the uterus can occur throughout pregnancy, but they are not always a sign that you are in labor. You may have practice contractions called Braxton Hicks contractions. These false labor contractions are sometimes confused with true labor. What are [place] contractions? Braxton Hicks contractions are tightening movements that occur in the muscles of the uterus before labor. Unlike true labor contractions, these contractions do not result in opening (dilation) and thinning of the cervix. Toward the end of pregnancy (32-34 weeks), Braxton Hicks contractions can happen more often and may become stronger. These contractions are sometimes difficult to tell apart from true labor because they can be very uncomfortable. You should not feel embarrassed if you go to the hospital with false labor. Sometimes, the only way to tell if you are in true labor is for your health care provider to look for changes in the cervix. The health care provider will do a physical exam and may monitor your contractions. If you are not in true labor, the exam should  show that your cervix is not dilating and your water has not broken. If there are other health problems associated with your pregnancy, it is completely safe for you to be sent home with false labor. You may continue to have Braxton Hicks contractions until you go into true labor. How to tell the difference between true labor and false labor True labor  Contractions last 30-70 seconds.  Contractions become very regular.  Discomfort is usually felt in the top of the uterus, and it spreads to the lower abdomen and low back.  Contractions do not go away with walking.  Contractions usually become more intense and increase in frequency.  The cervix dilates and gets thinner. False labor  Contractions are usually shorter and not as strong as true labor contractions.  Contractions are usually irregular.  Contractions are often felt in the front of the lower abdomen and in the groin.  Contractions may go away when you walk around or change positions while lying down.  Contractions get weaker and are shorter-lasting as time goes on.  The cervix usually does not dilate or become thin. Follow these instructions at home:  Take over-the-counter and prescription medicines only as told by your health care provider.  Keep up with your usual exercises and follow other instructions from your health care provider.  Eat and drink lightly if you think you are going into labor.  If Braxton Hicks contractions are making you uncomfortable: ? Change your position from lying  down or resting to walking, or change from walking to resting. ? Sit and rest in a tub of warm water. ? Drink enough fluid to keep your urine pale yellow. Dehydration may cause these contractions. ? Do slow and deep breathing several times an hour.  Keep all follow-up prenatal visits as told by your health care provider. This is important. Contact a health care provider if:  You have a fever.  You have continuous pain in  your abdomen. Get help right away if:  Your contractions become stronger, more regular, and closer together.  You have fluid leaking or gushing from your vagina.  You pass blood-tinged mucus (bloody show).  You have bleeding from your vagina.  You have low back pain that you never had before.  You feel your baby's head pushing down and causing pelvic pressure.  Your baby is not moving inside you as much as it used to. Summary  Contractions that occur before labor are called Braxton Hicks contractions, false labor, or practice contractions.  Braxton Hicks contractions are usually shorter, weaker, farther apart, and less regular than true labor contractions. True labor contractions usually become progressively stronger and regular and they become more frequent.  Manage discomfort from [facility] contractions by changing position, resting in a warm bath, drinking plenty of water, or practicing deep breathing. This information is not intended to replace advice given to you by your health care provider. Make sure you discuss any questions you have with your health care provider. Document Released: 05/16/2016 Document Revised: 05/16/2016 Document Reviewed: 05/16/2016 Elsevier Interactive Patient Education  2018 [employer].

## 2017-07-08 NOTE — Progress Notes (Signed)
   LOW-RISK PREGNANCY VISIT Patient name: Gloria Alvarado MRN [id number]  Date of birth: 04-02-1985 Chief Complaint:   Routine Prenatal Visit  History of Present Illness:   Gloria Alvarado is a 32 y.o. G65P0000 female at [redacted]w[redacted]d with an Estimated Date of Delivery: 07/26/17 being seen today for ongoing management of a low-risk pregnancy.  Today she reports no complaints. Contractions: Irritability. Vag. Bleeding: None.  Movement: Present. denies leaking of fluid. Review of Systems:   Pertinent items are noted in HPI Denies abnormal vaginal discharge w/ itching/odor/irritation, headaches, visual changes, shortness of breath, chest pain, abdominal pain, severe nausea/vomiting, or problems with urination or bowel movements unless otherwise stated above. Pertinent History Reviewed:  Reviewed past medical,surgical, social, obstetrical and family history.  Reviewed problem list, medications and allergies. Physical Assessment:   Vitals:   07/08/17 0917  BP: 116/64  Pulse: 91  Weight: 219 lb 6.4 oz (99.5 kg)  Body mass index is 36.51 kg/m.        Physical Examination:   General appearance: Well appearing, and in no distress  Mental status: Alert, oriented to person, place, and time  Skin: Warm & dry  Cardiovascular: Normal heart rate noted  Respiratory: Normal respiratory effort, no distress  Abdomen: Soft, gravid, nontender  Pelvic: Cervical exam performed  Dilation: Fingertip Effacement (%): Thick Station: -3  Extremities: Edema: Trace  Fetal Status: Fetal Heart Rate (bpm): 145 Fundal Height: 38 cm Movement: Present Presentation: Vertex  Results for orders placed or performed in visit on 07/08/17 (from the past 24 hour(s))  POCT Urinalysis Dipstick   Collection Time: 07/08/17  9:18 AM  Result Value Ref Range   Color, UA     Clarity, UA     Glucose, UA Negative Negative   Bilirubin, UA     Ketones, UA neg    Spec Grav, UA  1.010 - 1.025   Blood, UA neg    pH, UA  5.0 - 8.0   Protein, UA Negative Negative   Urobilinogen, UA  0.2 or 1.0 E.U./dL   Nitrite, UA neg    Leukocytes, UA Trace (A) Negative   Appearance     Odor      Assessment & Plan:  1) Low-risk pregnancy G1P0000 at [redacted]w[redacted]d with an Estimated Date of Delivery: 07/26/17   2) Suspected LGA, EFW 90% @ 36wks  3) SUA> plan IOL @ 40wks   Meds: No orders of the defined types were placed in this encounter.  Labs/procedures today: gbs, gc/ct, sve  Plan:  Continue routine obstetrical care   Reviewed: Term labor symptoms and general obstetric precautions including but not limited to vaginal bleeding, contractions, leaking of fluid and fetal movement were reviewed in detail with the patient.  All questions were answered  Follow-up: Return in about 1 week (around 07/15/2017) for [place].  Orders Placed This Encounter  Procedures  . GC/Chlamydia Probe Amp([facility])  . Culture, beta strep (group b only)  . POCT Urinalysis Dipstick   Roma Schanz CNM, [facility] 07/08/2017 9:55 AM

## 2017-07-10 LAB — GC/CHLAMYDIA PROBE AMP
CHLAMYDIA, DNA PROBE: NEGATIVE
Neisseria gonorrhoeae by PCR: NEGATIVE

## 2017-07-11 LAB — CULTURE, BETA STREP (GROUP B ONLY): Strep Gp B Culture: POSITIVE — AB

## 2017-07-14 ENCOUNTER — Encounter: Payer: Self-pay | Admitting: Obstetrics & Gynecology

## 2017-07-14 ENCOUNTER — Ambulatory Visit (INDEPENDENT_AMBULATORY_CARE_PROVIDER_SITE_OTHER): Payer: 59 | Admitting: Obstetrics & Gynecology

## 2017-07-14 VITALS — BP 115/62 | HR 95 | Wt 222.5 lb

## 2017-07-14 DIAGNOSIS — Z3483 Encounter for supervision of other normal pregnancy, third trimester: Secondary | ICD-10-CM

## 2017-07-14 DIAGNOSIS — Z331 Pregnant state, incidental: Secondary | ICD-10-CM

## 2017-07-14 DIAGNOSIS — O09899 Supervision of other high risk pregnancies, unspecified trimester: Secondary | ICD-10-CM

## 2017-07-14 DIAGNOSIS — Z3A38 38 weeks gestation of pregnancy: Secondary | ICD-10-CM

## 2017-07-14 DIAGNOSIS — Z1389 Encounter for screening for other disorder: Secondary | ICD-10-CM

## 2017-07-14 DIAGNOSIS — Z3403 Encounter for supervision of normal first pregnancy, third trimester: Secondary | ICD-10-CM

## 2017-07-14 LAB — POCT URINALYSIS DIPSTICK
Glucose, UA: NEGATIVE
KETONES UA: NEGATIVE
Nitrite, UA: NEGATIVE
Protein, UA: POSITIVE — AB

## 2017-07-14 NOTE — Progress Notes (Signed)
G1P0000 [redacted]w[redacted]d Estimated Date of Delivery: 07/26/17  Blood pressure 115/62, pulse 95, weight 222 lb 8 oz (100.9 kg), last menstrual period 10/07/2016.   BP weight and urine results all reviewed and noted.  Please refer to the obstetrical flow sheet for the fundal height and fetal heart rate documentation:  Patient reports good fetal movement, denies any bleeding and no rupture of membranes symptoms or regular contractions. Patient is without complaints. All questions were answered.  Orders Placed This Encounter  Procedures  . POCT urinalysis dipstick    Plan:  Continued routine obstetrical care,  SUA: plan induction at 40 weeks, suspected borderline macrosomia  Return in about 1 week (around 07/21/2017) for LROB.

## 2017-07-21 ENCOUNTER — Other Ambulatory Visit: Payer: Self-pay

## 2017-07-21 ENCOUNTER — Ambulatory Visit (INDEPENDENT_AMBULATORY_CARE_PROVIDER_SITE_OTHER): Payer: 59 | Admitting: Women's Health

## 2017-07-21 ENCOUNTER — Encounter: Payer: Self-pay | Admitting: Women's Health

## 2017-07-21 VITALS — BP 120/74 | HR 96 | Wt 224.0 lb

## 2017-07-21 DIAGNOSIS — Z3403 Encounter for supervision of normal first pregnancy, third trimester: Secondary | ICD-10-CM

## 2017-07-21 DIAGNOSIS — Z3A39 39 weeks gestation of pregnancy: Secondary | ICD-10-CM

## 2017-07-21 DIAGNOSIS — O359XX0 Maternal care for (suspected) fetal abnormality and damage, unspecified, not applicable or unspecified: Secondary | ICD-10-CM

## 2017-07-21 DIAGNOSIS — Z331 Pregnant state, incidental: Secondary | ICD-10-CM

## 2017-07-21 DIAGNOSIS — O09899 Supervision of other high risk pregnancies, unspecified trimester: Secondary | ICD-10-CM

## 2017-07-21 DIAGNOSIS — Z1389 Encounter for screening for other disorder: Secondary | ICD-10-CM

## 2017-07-21 LAB — POCT URINALYSIS DIPSTICK
Glucose, UA: NEGATIVE
KETONES UA: NEGATIVE
NITRITE UA: NEGATIVE
PROTEIN UA: NEGATIVE
RBC UA: NEGATIVE

## 2017-07-21 NOTE — Treatment Plan (Signed)
Induction Assessment Scheduling Form Fax to [facility]:  [phone]  Gloria Alvarado                                                                                   DOB:  Nov 10, 1985                                                            MRN:  [id number]                                                                     Phone #:   [phone]                         Provider:  [facility]  GP:  G1P0000                                                            Estimated Date of Delivery: 07/26/17  Dating Criteria: 7wk u/s    Medical Indications for induction:  SUA Admission Date/Time:  7/13 @ 0700 Gestational age on admission:  40.0   Filed Weights   07/21/17 0901  Weight: 224 lb (101.6 kg)   HIV:  Non Reactive (04/15 0858) GBS:  Pos  1/th/-2, vtx   Method of induction(proposed):  Cytotec/foley bulb   Scheduling Provider Signature:  Roma Schanz, CNM                                            Today's Date:  07/21/2017

## 2017-07-21 NOTE — Progress Notes (Signed)
   LOW-RISK PREGNANCY VISIT Patient name: Gloria Alvarado MRN [id number]  Date of birth: 12/16/1985 Chief Complaint:   Routine Prenatal Visit  History of Present Illness:   Gloria Alvarado is a 32 y.o. G40P0000 female at [redacted]w[redacted]d with an Estimated Date of Delivery: 07/26/17 being seen today for ongoing management of a low-risk pregnancy.  Today she reports no complaints. Contractions: Irregular. Vag. Bleeding: None.  Movement: Present. denies leaking of fluid. Review of Systems:   Pertinent items are noted in HPI Denies abnormal vaginal discharge w/ itching/odor/irritation, headaches, visual changes, shortness of breath, chest pain, abdominal pain, severe nausea/vomiting, or problems with urination or bowel movements unless otherwise stated above. Pertinent History Reviewed:  Reviewed past medical,surgical, social, obstetrical and family history.  Reviewed problem list, medications and allergies. Physical Assessment:   Vitals:   07/21/17 0901  BP: 120/74  Pulse: 96  Weight: 224 lb (101.6 kg)  Body mass index is 37.28 kg/m.        Physical Examination:   General appearance: Well appearing, and in no distress  Mental status: Alert, oriented to person, place, and time  Skin: Warm & dry  Cardiovascular: Normal heart rate noted  Respiratory: Normal respiratory effort, no distress  Abdomen: Soft, gravid, nontender  Pelvic: Cervical exam performed  Dilation: 1 Effacement (%): Thick Station: -2  Extremities: Edema: Trace  Fetal Status: Fetal Heart Rate (bpm): 147 Fundal Height: 39 cm Movement: Present Presentation: Vertex  Results for orders placed or performed in visit on 07/21/17 (from the past 24 hour(s))  POCT urinalysis dipstick   Collection Time: 07/21/17  9:01 AM  Result Value Ref Range   Color, UA     Clarity, UA     Glucose, UA Negative Negative   Bilirubin, UA     Ketones, UA neg    Spec Grav, UA  1.010 - 1.025   Blood, UA neg    pH, UA  5.0 - 8.0   Protein, UA Negative  Negative   Urobilinogen, UA  0.2 or 1.0 E.U./dL   Nitrite, UA neg    Leukocytes, UA Small (1+) (A) Negative   Appearance     Odor      Assessment & Plan:  1) Low-risk pregnancy G1P0000 at [redacted]w[redacted]d with an Estimated Date of Delivery: 07/26/17   2) SUA, IOL scheduled for Sat 7/13 @ 0700,  IOL form faxed via Epic and orders placed    Meds: No orders of the defined types were placed in this encounter.  Labs/procedures today: sve  Plan:  Continue routine obstetrical care   Reviewed: Term labor symptoms and general obstetric precautions including but not limited to vaginal bleeding, contractions, leaking of fluid and fetal movement were reviewed in detail with the patient.  All questions were answered  Follow-up: Return for 4-6wks for pp visit.  Orders Placed This Encounter  Procedures  . POCT urinalysis dipstick   Roma Schanz CNM, [facility] 07/21/2017 9:43 AM

## 2017-07-21 NOTE — Patient Instructions (Addendum)
Karle Plumber, I greatly value your feedback.  If you receive a survey following your visit with [place] today, we appreciate you taking the time to fill it out.  Thanks, Knute Neu, CNM, WHNP-BC  Your induction is scheduled for 7/13 @ 7:00am. Go to [facility] hospital, Maternity Admissions Unit (Emergency) entrance and let them know you are there to be induced. They will send someone from Labor & Delivery to come get you.     Call the office [phone]) or go to [facility] if:  You begin to have strong, frequent contractions  Your water breaks.  Sometimes it is a big gush of fluid, sometimes it is just a trickle that keeps getting your panties wet or running down your legs  You have vaginal bleeding.  It is normal to have a small amount of spotting if your cervix was checked.   You don't feel your baby moving like normal.  If you don't, get you something to eat and drink and lay down and focus on feeling your baby move.  You should feel at least 10 movements in 2 hours.  If you don't, you should call the office or go to [place] Contractions Contractions of the uterus can occur throughout pregnancy, but they are not always a sign that you are in labor. You may have practice contractions called Braxton Hicks contractions. These false labor contractions are sometimes confused with true labor. What are [place] contractions? Braxton Hicks contractions are tightening movements that occur in the muscles of the uterus before labor. Unlike true labor contractions, these contractions do not result in opening (dilation) and thinning of the cervix. Toward the end of pregnancy (32-34 weeks), Braxton Hicks contractions can happen more often and may become stronger. These contractions are sometimes difficult to tell apart from true labor because they can be very uncomfortable. You should not feel embarrassed if you go to the hospital with false labor. Sometimes, the only way to  tell if you are in true labor is for your health care provider to look for changes in the cervix. The health care provider will do a physical exam and may monitor your contractions. If you are not in true labor, the exam should show that your cervix is not dilating and your water has not broken. If there are other health problems associated with your pregnancy, it is completely safe for you to be sent home with false labor. You may continue to have Braxton Hicks contractions until you go into true labor. How to tell the difference between true labor and false labor True labor  Contractions last 30-70 seconds.  Contractions become very regular.  Discomfort is usually felt in the top of the uterus, and it spreads to the lower abdomen and low back.  Contractions do not go away with walking.  Contractions usually become more intense and increase in frequency.  The cervix dilates and gets thinner. False labor  Contractions are usually shorter and not as strong as true labor contractions.  Contractions are usually irregular.  Contractions are often felt in the front of the lower abdomen and in the groin.  Contractions may go away when you walk around or change positions while lying down.  Contractions get weaker and are shorter-lasting as time goes on.  The cervix usually does not dilate or become thin. Follow these instructions at home:  Take over-the-counter and prescription medicines only as told by your health care provider.  Keep up  with your usual exercises and follow other instructions from your health care provider.  Eat and drink lightly if you think you are going into labor.  If Braxton Hicks contractions are making you uncomfortable: ? Change your position from lying down or resting to walking, or change from walking to resting. ? Sit and rest in a tub of warm water. ? Drink enough fluid to keep your urine pale yellow. Dehydration may cause these contractions. ? Do slow  and deep breathing several times an hour.  Keep all follow-up prenatal visits as told by your health care provider. This is important. Contact a health care provider if:  You have a fever.  You have continuous pain in your abdomen. Get help right away if:  Your contractions become stronger, more regular, and closer together.  You have fluid leaking or gushing from your vagina.  You pass blood-tinged mucus (bloody show).  You have bleeding from your vagina.  You have low back pain that you never had before.  You feel your baby's head pushing down and causing pelvic pressure.  Your baby is not moving inside you as much as it used to. Summary  Contractions that occur before labor are called Braxton Hicks contractions, false labor, or practice contractions.  Braxton Hicks contractions are usually shorter, weaker, farther apart, and less regular than true labor contractions. True labor contractions usually become progressively stronger and regular and they become more frequent.  Manage discomfort from [facility] contractions by changing position, resting in a warm bath, drinking plenty of water, or practicing deep breathing. This information is not intended to replace advice given to you by your health care provider. Make sure you discuss any questions you have with your health care provider. Document Released: 05/16/2016 Document Revised: 05/16/2016 Document Reviewed: 05/16/2016 Elsevier Interactive Patient Education  2018 [employer].

## 2017-07-22 ENCOUNTER — Telehealth (HOSPITAL_COMMUNITY): Payer: Self-pay | Admitting: *Deleted

## 2017-07-22 ENCOUNTER — Encounter (HOSPITAL_COMMUNITY): Payer: Self-pay | Admitting: *Deleted

## 2017-07-22 NOTE — Telephone Encounter (Signed)
Preadmission screen  

## 2017-07-26 ENCOUNTER — Inpatient Hospital Stay (HOSPITAL_COMMUNITY): Payer: 59 | Admitting: Anesthesiology

## 2017-07-26 ENCOUNTER — Encounter (HOSPITAL_COMMUNITY): Payer: Self-pay

## 2017-07-26 ENCOUNTER — Inpatient Hospital Stay (HOSPITAL_COMMUNITY)
Admission: RE | Admit: 2017-07-26 | Discharge: 2017-07-29 | DRG: 807 | Disposition: A | Discharge status: Home / Self Care | Payer: 59 | Source: Ambulatory Visit | Attending: Obstetrics & Gynecology | Admitting: Obstetrics & Gynecology

## 2017-07-26 ENCOUNTER — Other Ambulatory Visit: Payer: Self-pay

## 2017-07-26 DIAGNOSIS — O99824 Streptococcus B carrier state complicating childbirth: Secondary | ICD-10-CM | POA: Diagnosis present

## 2017-07-26 DIAGNOSIS — Z3A40 40 weeks gestation of pregnancy: Secondary | ICD-10-CM

## 2017-07-26 DIAGNOSIS — Z3403 Encounter for supervision of normal first pregnancy, third trimester: Secondary | ICD-10-CM

## 2017-07-26 DIAGNOSIS — F329 Major depressive disorder, single episode, unspecified: Secondary | ICD-10-CM | POA: Diagnosis present

## 2017-07-26 DIAGNOSIS — O99344 Other mental disorders complicating childbirth: Secondary | ICD-10-CM | POA: Diagnosis present

## 2017-07-26 DIAGNOSIS — Z87891 Personal history of nicotine dependence: Secondary | ICD-10-CM | POA: Diagnosis not present

## 2017-07-26 DIAGNOSIS — Z79899 Other long term (current) drug therapy: Secondary | ICD-10-CM

## 2017-07-26 DIAGNOSIS — O09899 Supervision of other high risk pregnancies, unspecified trimester: Secondary | ICD-10-CM

## 2017-07-26 LAB — CBC
HCT: 37.8 % (ref 36.0–46.0)
HEMOGLOBIN: 13.1 g/dL (ref 12.0–15.0)
MCH: 32.7 pg (ref 26.0–34.0)
MCHC: 34.7 g/dL (ref 30.0–36.0)
MCV: 94.3 fL (ref 78.0–100.0)
Platelets: 183 10*3/uL (ref 150–400)
RBC: 4.01 MIL/uL (ref 3.87–5.11)
RDW: 14.1 % (ref 11.5–15.5)
WBC: 7.9 10*3/uL (ref 4.0–10.5)

## 2017-07-26 LAB — TYPE AND SCREEN
ABO/RH(D): O POS
Antibody Screen: NEGATIVE

## 2017-07-26 LAB — RPR: RPR Ser Ql: NONREACTIVE

## 2017-07-26 LAB — ABO/RH: ABO/RH(D): O POS

## 2017-07-26 MED ORDER — EPHEDRINE 5 MG/ML INJ: 10.0000 mg | INTRAVENOUS | Status: DC | PRN

## 2017-07-26 MED ORDER — ACETAMINOPHEN 325 MG PO TABS: 650.0000 mg | ORAL_TABLET | ORAL | Status: DC | PRN

## 2017-07-26 MED ORDER — DIPHENHYDRAMINE HCL 50 MG/ML IJ SOLN: 12.5000 mg | INTRAMUSCULAR | Status: DC | PRN

## 2017-07-26 MED ORDER — TERBUTALINE SULFATE 1 MG/ML IJ SOLN
0.2500 mg | Freq: Once | INTRAMUSCULAR | Status: DC | PRN
  Filled 2017-07-26: qty 1

## 2017-07-26 MED ORDER — OXYTOCIN BOLUS FROM INFUSION
500.0000 mL | Freq: Once | INTRAVENOUS | Status: AC
  Administered 2017-07-27: 500 mL via INTRAVENOUS

## 2017-07-26 MED ORDER — OXYCODONE-ACETAMINOPHEN 5-325 MG PO TABS: 2.0000 | ORAL_TABLET | ORAL | Status: DC | PRN

## 2017-07-26 MED ORDER — EPHEDRINE 5 MG/ML INJ
10.0000 mg | INTRAVENOUS | Status: DC | PRN
  Filled 2017-07-26: qty 2

## 2017-07-26 MED ORDER — LACTATED RINGERS IV SOLN
500.0000 mL | INTRAVENOUS | Status: DC | PRN
  Administered 2017-07-27: 500 mL via INTRAVENOUS

## 2017-07-26 MED ORDER — FENTANYL CITRATE (PF) 100 MCG/2ML IJ SOLN
50.0000 ug | INTRAMUSCULAR | Status: DC | PRN
  Administered 2017-07-26: 100 ug via INTRAVENOUS
  Filled 2017-07-26 (×2): qty 2

## 2017-07-26 MED ORDER — LACTATED RINGERS IV SOLN
500.0000 mL | Freq: Once | INTRAVENOUS | Status: AC
  Administered 2017-07-26: 500 mL via INTRAVENOUS

## 2017-07-26 MED ORDER — LIDOCAINE HCL (PF) 1 % IJ SOLN
INTRAMUSCULAR | Status: DC | PRN
  Administered 2017-07-26: 5 mL via EPIDURAL

## 2017-07-26 MED ORDER — PENICILLIN G POT IN DEXTROSE 60000 UNIT/ML IV SOLN
3.0000 10*6.[IU] | INTRAVENOUS | Status: DC
  Administered 2017-07-26 (×4): 3 10*6.[IU] via INTRAVENOUS
  Filled 2017-07-26 (×7): qty 50

## 2017-07-26 MED ORDER — SODIUM CHLORIDE 0.9 % IV SOLN
5.0000 10*6.[IU] | Freq: Once | INTRAVENOUS | Status: AC
  Administered 2017-07-26: 5 10*6.[IU] via INTRAVENOUS
  Filled 2017-07-26: qty 5

## 2017-07-26 MED ORDER — PHENYLEPHRINE 40 MCG/ML (10ML) SYRINGE FOR IV PUSH (FOR BLOOD PRESSURE SUPPORT): 80.0000 ug | PREFILLED_SYRINGE | INTRAVENOUS | Status: DC | PRN

## 2017-07-26 MED ORDER — ONDANSETRON HCL 4 MG/2ML IJ SOLN
4.0000 mg | Freq: Four times a day (QID) | INTRAMUSCULAR | Status: DC | PRN
  Administered 2017-07-26: 4 mg via INTRAVENOUS
  Filled 2017-07-26: qty 2

## 2017-07-26 MED ORDER — FENTANYL 2.5 MCG/ML BUPIVACAINE 1/10 % EPIDURAL INFUSION (WH - ANES)
14.0000 mL/h | INTRAMUSCULAR | Status: DC | PRN
  Administered 2017-07-26: 14 mL/h via EPIDURAL
  Filled 2017-07-26: qty 100

## 2017-07-26 MED ORDER — OXYTOCIN 40 UNITS IN LACTATED RINGERS INFUSION - SIMPLE MED
2.5000 [IU]/h | INTRAVENOUS | Status: DC
  Administered 2017-07-27: 2.5 [IU]/h via INTRAVENOUS

## 2017-07-26 MED ORDER — MISOPROSTOL 25 MCG QUARTER TABLET
25.0000 ug | ORAL_TABLET | ORAL | Status: DC | PRN
  Administered 2017-07-26: 25 ug via VAGINAL
  Filled 2017-07-26: qty 1

## 2017-07-26 MED ORDER — HYDROXYZINE HCL 50 MG PO TABS
50.0000 mg | ORAL_TABLET | Freq: Four times a day (QID) | ORAL | Status: DC | PRN
  Filled 2017-07-26: qty 1

## 2017-07-26 MED ORDER — MISOPROSTOL 50MCG HALF TABLET
50.0000 ug | ORAL_TABLET | ORAL | Status: DC | PRN
  Filled 2017-07-26: qty 1

## 2017-07-26 MED ORDER — LACTATED RINGERS IV SOLN: 500.0000 mL | Freq: Once | INTRAVENOUS | Status: DC

## 2017-07-26 MED ORDER — PHENYLEPHRINE 40 MCG/ML (10ML) SYRINGE FOR IV PUSH (FOR BLOOD PRESSURE SUPPORT)
80.0000 ug | PREFILLED_SYRINGE | INTRAVENOUS | Status: DC | PRN
  Filled 2017-07-26: qty 10
  Filled 2017-07-26: qty 5

## 2017-07-26 MED ORDER — OXYCODONE-ACETAMINOPHEN 5-325 MG PO TABS: 1.0000 | ORAL_TABLET | ORAL | Status: DC | PRN

## 2017-07-26 MED ORDER — PHENYLEPHRINE 40 MCG/ML (10ML) SYRINGE FOR IV PUSH (FOR BLOOD PRESSURE SUPPORT)
80.0000 ug | PREFILLED_SYRINGE | INTRAVENOUS | Status: DC | PRN
  Filled 2017-07-26: qty 5

## 2017-07-26 MED ORDER — LIDOCAINE HCL (PF) 1 % IJ SOLN
30.0000 mL | INTRAMUSCULAR | Status: DC | PRN
  Administered 2017-07-27: 30 mL via SUBCUTANEOUS
  Filled 2017-07-26: qty 30

## 2017-07-26 MED ORDER — OXYTOCIN 40 UNITS IN LACTATED RINGERS INFUSION - SIMPLE MED
1.0000 m[IU]/min | INTRAVENOUS | Status: DC
  Administered 2017-07-26: 2 m[IU]/min via INTRAVENOUS
  Administered 2017-07-27: 10 m[IU]/min via INTRAVENOUS
  Filled 2017-07-26: qty 1000

## 2017-07-26 MED ORDER — FLEET ENEMA 7-19 GM/118ML RE ENEM: 1.0000 | ENEMA | RECTAL | Status: DC | PRN

## 2017-07-26 MED ORDER — SOD CITRATE-CITRIC ACID 500-334 MG/5ML PO SOLN
30.0000 mL | ORAL | Status: DC | PRN
  Administered 2017-07-26: 30 mL via ORAL
  Filled 2017-07-26: qty 15

## 2017-07-26 MED ORDER — LACTATED RINGERS IV SOLN
INTRAVENOUS | Status: DC
  Administered 2017-07-26 (×3): via INTRAVENOUS

## 2017-07-26 NOTE — Anesthesia Procedure Notes (Addendum)
Epidural Patient location during procedure: OB Start time: 07/26/2017 9:13 PM End time: 07/26/2017 9:29 PM  Staffing Anesthesiologist: Barnet Glasgow, MD Performed: anesthesiologist   Preanesthetic Checklist Completed: patient identified, site marked, surgical consent, pre-op evaluation, timeout performed, IV checked, risks and benefits discussed and monitors and equipment checked  Epidural Patient position: sitting Prep: site prepped and draped and DuraPrep Patient monitoring: continuous pulse ox and blood pressure Approach: midline Location: L3-L4 Injection technique: LOR air  Needle:  Needle type: Tuohy  Needle gauge: 17 G Needle length: 9 cm and 9 Needle insertion depth: 8 cm Catheter type: closed end flexible Catheter size: 19 Gauge Catheter at skin depth: 13 cm Test dose: negative  Assessment Events: blood not aspirated, injection not painful, no injection resistance, negative IV test and no paresthesia

## 2017-07-26 NOTE — Anesthesia Pain Management Evaluation Note (Signed)
  CRNA Pain Management Visit Note  Patient: Gloria Alvarado, 32 y.o., female  "Hello I am a member of the anesthesia team at [facility]. We have an anesthesia team available at all times to provide care throughout the hospital, including epidural management and anesthesia for C-section. I don't know your plan for the delivery whether it a natural birth, water birth, IV sedation, nitrous supplementation, doula or epidural, but we want to meet your pain goals."   1.Was your pain managed to your expectations on prior hospitalizations?   No prior hospitalizations  2.What is your expectation for pain management during this hospitalization?     Epidural  3.How can we help you reach that goal? Support prn  Record the patient's initial score and the patient's pain goal.   Pain: 8  Pain Goal: 4 [facility] wants you to be able to say your pain was always managed very well.  [facility] 07/26/2017

## 2017-07-26 NOTE — Anesthesia Preprocedure Evaluation (Signed)
Anesthesia Evaluation  Patient identified by MRN, date of birth, ID band Patient awake    Reviewed: Allergy & Precautions, NPO status , Patient's Chart, lab work & pertinent test results  Airway Mallampati: II  TM Distance: >3 FB Neck ROM: Full    Dental no notable dental hx. (+) Teeth Intact   Pulmonary former smoker,    Pulmonary exam normal breath sounds clear to auscultation       Cardiovascular negative cardio ROS Normal cardiovascular exam Rhythm:Regular Rate:Normal     Neuro/Psych Anxiety negative neurological ROS     GI/Hepatic   Endo/Other  Morbid obesity  Renal/GU      Musculoskeletal   Abdominal (+) + obese,   Peds  Hematology  (+) anemia ,   Anesthesia Other Findings   Reproductive/Obstetrics (+) Pregnancy                             Lab Results  Component Value Date   WBC 7.9 07/26/2017   HGB 13.1 07/26/2017   HCT 37.8 07/26/2017   MCV 94.3 07/26/2017   PLT 183 07/26/2017    Anesthesia Physical Anesthesia Plan  ASA: III  Anesthesia Plan: Epidural   Post-op Pain Management:    Induction:   PONV Risk Score and Plan:   Airway Management Planned:   Additional Equipment:   Intra-op Plan:   Post-operative Plan:   Informed Consent: I have reviewed the patients History and Physical, chart, labs and discussed the procedure including the risks, benefits and alternatives for the proposed anesthesia with the patient or authorized representative who has indicated his/her understanding and acceptance.     Plan Discussed with:   Anesthesia Plan Comments:         Anesthesia Quick Evaluation

## 2017-07-26 NOTE — Progress Notes (Addendum)
Labor Progress Note Gloria Alvarado is a 32 y.o. G1P0000 at [redacted]w[redacted]d presented for IOL for SUA  S: Patient comfortable. No questions or concerns.  O:  BP 130/80   Pulse 76   Temp 97.9 F (36.6 C) (Oral)   Resp 18   Ht 5\' 5"  (1.651 m)   Wt 225 lb 4.8 oz (102.2 kg)   LMP 10/07/2016 (Approximate)   BMI 37.49 kg/m   EMV:VKPQAESL rate 135, moderate variability, + acels, no decels Toco: ctx every 2-3 min  CVE: Dilation: 1.5 Effacement (%): 50 Cervical Position: Middle Station: -3 Presentation: Vertex Exam by:: Dr Ky Barban  A&P: 32 y.o. G1P0000 [redacted]w[redacted]d here for IOL for SUA. Labor: Progressing well. S/p cytotec x1. FB placed. Pain: Epidural on request FWB: Cat I GBS positive - PCN  Rory Percy, DO 12:30 PM

## 2017-07-26 NOTE — H&P (Addendum)
OBSTETRIC ADMISSION HISTORY AND PHYSICAL  Gloria Alvarado is a 32 y.o. female G1P0000 with IUP at 59w0dby 7wk [place] for IOL for SUA. She reports +FMs, No LOF, no VB, no blurry vision, headaches or peripheral edema, and RUQ pain.  She plans on breast feeding. She is unsure but thinks she may want an IUD for birth control. [facility]  Dating: By 7 wk [place]--->  Estimated Date of Delivery: 07/26/17  Sono:   @[redacted]w[redacted]d , CWD, normal anatomy with 2VC, cephalic presentation, 35009F 90% EFW. Posterior placenta. AFI normal.  Prenatal History/Complications: Single umbilical artery LGA (38182X 90% @ 362w4dRubella non-immune H/o depression - on Wellbutrin  Past Medical History: Past Medical History:  Diagnosis Date  . Abdominal pain 04/17/2015  . Back pain 06/05/2015  . Contraceptive management 05/03/2013  . Depression 04/17/2015  . Dizzy spells 04/17/2015  . Gas 04/17/2015  . Hemangioma   . Nausea 04/17/2015  . Shortness of breath 04/17/2015    Past Surgical History: Past Surgical History:  Procedure Laterality Date  . COLONOSCOPY N/A 07/27/2015   Procedure: COLONOSCOPY;  Surgeon: NaRogene HoustonMD;  Location: [facility];  Service: Endoscopy;  Laterality: N/A;  225 - moved to 1:30 - Ann notified pt  . WISDOM TOOTH EXTRACTION      Obstetrical History: OB History    Gravida  1   Para  0   Term  0   Preterm  0   AB  0   Living  0     SAB  0   TAB  0   Ectopic  0   Multiple  0   Live Births              Social History: Social History   Socioeconomic History  . Marital status: Married    Spouse name: Not on file  . Number of children: Not on file  . Years of education: Not on file  . Highest education level: Not on file  Occupational History  . Not on file  Social Needs  . Financial resource strain: Not on file  . Food insecurity:    Worry: Not on file    Inability: Not on file  . Transportation needs:    Medical: Not  on file    Non-medical: Not on file  Tobacco Use  . Smoking status: Former Smoker    Packs/day: 0.25    Types: Cigarettes    Start date: 04/25/2000    Last attempt to quit: 04/25/2005    Years since quitting: 12.2  . Smokeless tobacco: Never Used  Substance and Sexual Activity  . Alcohol use: No    Alcohol/week: 0.0 oz    Frequency: Never    Comment: Occasional before pregnancy  . Drug use: No  . Sexual activity: Yes    Birth control/protection: None  Lifestyle  . Physical activity:    Days per week: Not on file    Minutes per session: Not on file  . Stress: Not on file  Relationships  . Social connections:    Talks on phone: Not on file    Gets together: Not on file    Attends religious service: Not on file    Active member of club or organization: Not on file    Attends meetings of clubs or organizations: Not on file    Relationship status: Not on file  Other Topics Concern  . Not on  file  Social History Narrative  . Not on file    Family History: Family History  Problem Relation Age of Onset  . Hypertension Mother   . Hyperlipidemia Mother   . Cancer Maternal Grandfather   . Hypertension Sister   . Thyroid disease Maternal Grandmother   . Prostate cancer Father   . Hyperlipidemia Father   . Ovarian cysts Maternal Aunt     Allergies: Allergies  Allergen Reactions  . Sulfa Antibiotics Shortness Of Breath, Swelling and Other (See Comments)    Throat swelling, shortness of breath.    Medications Prior to Admission  Medication Sig Dispense Refill Last Dose  . acetaminophen (TYLENOL) 325 MG tablet Take 650 mg by mouth every 6 (six) hours as needed.   Taking  . buPROPion (WELLBUTRIN SR) 150 MG 12 hr tablet Take 1 tablet (150 mg total) by mouth daily. 90 tablet 3 Taking  . loratadine (CLARITIN) 10 MG tablet Take 10 mg by mouth daily.   Taking  . Prenatal Vit-Fe Fumarate-FA (MULTIVITAMIN-PRENATAL) 27-0.8 MG TABS tablet Take 1 tablet by mouth daily at 12 noon.    Taking  . Probiotic Product (PROBIOTIC PO) Take by mouth daily.   Taking  . RaNITidine HCl (ZANTAC PO) Take by mouth as needed.   Taking     Review of Systems   All systems reviewed and negative except as stated in HPI  Blood pressure 131/89, pulse (!) 105, temperature 98.2 F (36.8 C), temperature source Oral, resp. rate 18, height 5' 5"  (1.651 m), weight 225 lb 4.8 oz (102.2 kg), last menstrual period 10/07/2016. General appearance: alert, cooperative, appears stated age and no distress Lungs: clear to auscultation bilaterally Heart: regular rate and rhythm Abdomen: soft, non-tender; bowel sounds normal Extremities: Homans sign is negative, no sign of DVT Presentation: cephalic Fetal monitoringBaseline: 155 bpm, Variability: Good {> 6 bpm), Accelerations: Reactive and Decelerations: Absent Uterine activity every 5-6 min Dilation: 1 Effacement (%): 50 Station: -3 Exam by:: Sharyn Lull, RN   Prenatal labs: ABO, Rh: --/--/O POS (07/13 0825) Antibody: NEG (07/13 0825) Rubella: <0.90 (12/17 1111) Non-immune RPR: Non Reactive (04/15 0858)  HBsAg: Negative (12/17 1111)  HIV: Non Reactive (04/15 0858)  GBS: Positive (06/25 0000)  2 hr Glucola 81, 145, 137 Genetic screening  NT/IT negative Anatomy US @ [redacted]w[redacted]d normal anatomy w/ SUA. Female fetus.  Prenatal Transfer Tool  Maternal Diabetes: No Genetic Screening: Normal Maternal Ultrasounds/Referrals: Abnormal:  Findings:   Other: SUA Fetal Ultrasounds or other Referrals:  None Maternal Substance Abuse:  No Significant Maternal Medications:  Meds include: Other: Wellbutrin Significant Maternal Lab Results: Lab values include: Group B Strep positive, Rubella non-immune  Results for orders placed or performed during the hospital encounter of 07/26/17 (from the past 24 hour(s))  CBC   Collection Time: 07/26/17  8:25 AM  Result Value Ref Range   WBC 7.9 4.0 - 10.5 K/uL   RBC 4.01 3.87 - 5.11 MIL/uL   Hemoglobin 13.1 12.0 - 15.0  g/dL   HCT 37.8 36.0 - 46.0 %   MCV 94.3 78.0 - 100.0 fL   MCH 32.7 26.0 - 34.0 pg   MCHC 34.7 30.0 - 36.0 g/dL   RDW 14.1 11.5 - 15.5 %   Platelets 183 150 - 400 K/uL  Type and screen   Collection Time: 07/26/17  8:25 AM  Result Value Ref Range   ABO/RH(D) O POS    Antibody Screen NEG    Sample Expiration  07/29/2017 Performed at [facility], [address] [place], [place] [address]     Patient Active Problem List   Diagnosis Date Noted  . Single umbilical artery, maternal, antepartum 07/26/2017  . Two vessel umbilical cord, antepartum 03/31/2017  . Rubella non-immune status, antepartum 12/31/2016  . Supervision of normal first pregnancy 12/30/2016  . [place] 06/24/2016  . Back pain 06/05/2015  . Hemangioma of liver 04/20/2015  . Dizzy spells 04/17/2015  . Abdominal pain 04/17/2015  . Shortness of breath 04/17/2015  . Depression with anxiety 04/17/2015    Assessment/Plan:  PARNEET GLANTZ is a 32 y.o. G1P0000 at 76w0dhere for IOL for SUA.  #Labor: Induce with Cytotec. Plan to place FB when able. Anticipate SVD. #Pain: Epidural on patient request #FWB: Cat I #ID:  GBS positive - PCN #MOF: breast #MOC:unsure, maybe IUD #Circ:  N/A #H/o depression: continue wellbutrin #Rubella non-immune: MMR PP  ARory Percy DO  07/26/2017, 9:31 AM   Midwife attestation: I have seen and examined this patient; I agree with above documentation in the resident's note.   Gloria Alvarado a 32y.o. G1P0000 here for IOL for SUA  PE: Gen: calm comfortable, NAD Resp: normal effort, no distress Abd: gravid  ROS, labs, PMH reviewed  Assessment/Plan: Admit to LD Labor: latent FWB: Cat I ID: GBS pos>PCN Suspected macrosomia  MJulianne Handler CNM  07/26/2017, 11:12 AM

## 2017-07-26 NOTE — Progress Notes (Signed)
Labor Progress Note Gloria Alvarado is a 32 y.o. G1P0000 at [redacted]w[redacted]d presented for IOL for SUA  S: Patient feeling more pressure and pain with contractions, good relief with IV meds  O:  BP (!) 142/74   Pulse 62   Temp 97.9 F (36.6 C) (Oral)   Resp 18   Ht 5\' 5"  (1.651 m)   Wt 225 lb 4.8 oz (102.2 kg)   LMP 10/07/2016 (Approximate)   BMI 37.49 kg/m   AST:MHDQQIWL rate 125, moderate variability, + acels, no decels Toco: ctx every 2-4 min  CVE: Dilation: 1.5 Effacement (%): 50 Cervical Position: Middle Station: -3 Presentation: Vertex Exam by:: Dr Ky Barban  A&P: 32 y.o. G1P0000 [redacted]w[redacted]d here for IOL for SUA. Labor: Contractions picking up. Held off on 2nd cytotec due to frequency of contraction pattern. FB still in place. Will plan to start pitocin once FB out. Pain: well controlled with IV meds for now FWB: Cat I  Rory Percy, DO 4:13 PM

## 2017-07-26 NOTE — Progress Notes (Signed)
Labor Progress Note Gloria Alvarado is a 32 y.o. G1P0000 at [redacted]w[redacted]d presented for IOL for SUA  S: Patient feeling more pressure and pain with contractions, able to breath through it but understands that increasing Pitocin will make contractions stronger. Requested epidural.   O:  BP 121/68   Pulse 89   Temp 97.8 F (36.6 C) (Oral)   Resp 18   Ht 5\' 5"  (1.651 m)   Wt 102.2 kg (225 lb 4.8 oz)   LMP 10/07/2016 (Approximate)   BMI 37.49 kg/m   KGO:VPCHEKBT rate 140, moderate variability, + acels, variables present Toco: ctx every 2-4 min, pain increasing with contractions  CVE: Dilation: 4 Effacement (%): 50 Cervical Position: Middle Station: -2 Presentation: Vertex Exam by:: Gloria Lull, RN   A&P: 32 y.o. G1P0000 [redacted]w[redacted]d here for IOL for SUA.  Labor: Contractions picking up. S/p FB. Will continue to increase pitocin until adequate contraction pattern.  GBS +: receiving PCN prophylaxis Pain: Requested epidural. FWB: Cat I  Sherene Sires, Medical Student 9:07 PM

## 2017-07-27 ENCOUNTER — Encounter (HOSPITAL_COMMUNITY): Payer: Self-pay

## 2017-07-27 DIAGNOSIS — O99824 Streptococcus B carrier state complicating childbirth: Secondary | ICD-10-CM

## 2017-07-27 DIAGNOSIS — Z3A40 40 weeks gestation of pregnancy: Secondary | ICD-10-CM

## 2017-07-27 MED ORDER — PRENATAL MULTIVITAMIN CH
1.0000 | ORAL_TABLET | Freq: Every day | ORAL | Status: DC
  Administered 2017-07-27 – 2017-07-29 (×3): 1 via ORAL
  Filled 2017-07-27 (×3): qty 1

## 2017-07-27 MED ORDER — WITCH HAZEL-GLYCERIN EX PADS: 1.0000 "application " | MEDICATED_PAD | CUTANEOUS | Status: DC | PRN

## 2017-07-27 MED ORDER — SENNOSIDES-DOCUSATE SODIUM 8.6-50 MG PO TABS
2.0000 | ORAL_TABLET | ORAL | Status: DC
  Administered 2017-07-27 – 2017-07-29 (×2): 2 via ORAL
  Filled 2017-07-27 (×2): qty 2

## 2017-07-27 MED ORDER — IBUPROFEN 600 MG PO TABS
600.0000 mg | ORAL_TABLET | Freq: Four times a day (QID) | ORAL | Status: DC
  Administered 2017-07-27 – 2017-07-29 (×10): 600 mg via ORAL
  Filled 2017-07-27 (×10): qty 1

## 2017-07-27 MED ORDER — BENZOCAINE-MENTHOL 20-0.5 % EX AERO
1.0000 "application " | INHALATION_SPRAY | CUTANEOUS | Status: DC | PRN
  Filled 2017-07-27: qty 56

## 2017-07-27 MED ORDER — MISOPROSTOL 200 MCG PO TABS
600.0000 ug | ORAL_TABLET | Freq: Once | ORAL | Status: AC
  Administered 2017-07-27: 600 ug via RECTAL

## 2017-07-27 MED ORDER — COCONUT OIL OIL: 1.0000 "application " | TOPICAL_OIL | Status: DC | PRN

## 2017-07-27 MED ORDER — MISOPROSTOL 200 MCG PO TABS
ORAL_TABLET | ORAL | Status: AC
  Filled 2017-07-27: qty 3

## 2017-07-27 MED ORDER — CALCIUM CARBONATE ANTACID 500 MG PO CHEW
1.0000 | CHEWABLE_TABLET | Freq: Once | ORAL | Status: AC
  Administered 2017-07-27: 200 mg via ORAL
  Filled 2017-07-27: qty 1

## 2017-07-27 MED ORDER — SIMETHICONE 80 MG PO CHEW: 80.0000 mg | CHEWABLE_TABLET | ORAL | Status: DC | PRN

## 2017-07-27 MED ORDER — ACETAMINOPHEN 325 MG PO TABS: 650.0000 mg | ORAL_TABLET | ORAL | Status: DC | PRN

## 2017-07-27 MED ORDER — DIPHENHYDRAMINE HCL 25 MG PO CAPS: 25.0000 mg | ORAL_CAPSULE | Freq: Four times a day (QID) | ORAL | Status: DC | PRN

## 2017-07-27 MED ORDER — ONDANSETRON HCL 4 MG/2ML IJ SOLN: 4.0000 mg | INTRAMUSCULAR | Status: DC | PRN

## 2017-07-27 MED ORDER — ZOLPIDEM TARTRATE 5 MG PO TABS: 5.0000 mg | ORAL_TABLET | Freq: Every evening | ORAL | Status: DC | PRN

## 2017-07-27 MED ORDER — TETANUS-DIPHTH-ACELL PERTUSSIS 5-2.5-18.5 LF-MCG/0.5 IM SUSP: 0.5000 mL | Freq: Once | INTRAMUSCULAR | Status: DC

## 2017-07-27 MED ORDER — ONDANSETRON HCL 4 MG PO TABS: 4.0000 mg | ORAL_TABLET | ORAL | Status: DC | PRN

## 2017-07-27 MED ORDER — DIBUCAINE 1 % RE OINT: 1.0000 "application " | TOPICAL_OINTMENT | RECTAL | Status: DC | PRN

## 2017-07-27 NOTE — Lactation Note (Signed)
This note was copied from a baby's chart. Lactation Consultation Note  Patient Name: Gloria Alvarado [id number] Date: 07/27/2017 Reason for consult: Follow-up assessment  Assisted with latch.  Baby positioned in football hold on right breast using a 20 mm nipple shield.  Mom taught how to apply nipple shield following hand pumping.  Baby able to attain a deep areolar grasp.  Feeding comfortable. Mom taught how to support breast and use alternate breast compression to increase milk transfer.  Mom able to pre-pump and obtain 2 ml of colostrum which was instilled into nipple shield.    RN asked to set up DEBP.  Mom instructed to pump after breastfeeding 4-6 times a day.   Encouraged STS as much as possible.   Feeding Feeding Type: Breast Fed Length of feed: 10 min  LATCH Score Latch: Grasps breast easily, tongue down, lips flanged, rhythmical sucking.  Audible Swallowing: Spontaneous and intermittent  Type of Nipple: Flat  Comfort (Breast/Nipple): Filling, red/small blisters or bruises, mild/mod discomfort  Hold (Positioning): Assistance needed to correctly position infant at breast and maintain latch.  LATCH Score: 7  Interventions Interventions: Shells  Lactation Tools Discussed/Used Tools: Nipple Shields Nipple shield size: 20 Shell Type: Inverted   Consult Status Consult Status: Follow-up Date: 07/28/17 Follow-up type: In-patient    Broadus John 07/27/2017, 1:24 PM

## 2017-07-27 NOTE — Progress Notes (Signed)
Labor Progress Note  Gloria Alvarado is a 32 y.o. G1P0000 at [redacted]w[redacted]d admitted for induction of labor due to single umbilical artery.  S: Feeling pressure with contractions.   O:  BP 133/71   Pulse 95   Temp 98.1 F (36.7 C) (Oral)   Resp (!) 24   Ht 5\' 5"  (1.651 m)   Wt 225 lb 4.8 oz (102.2 kg)   LMP 10/07/2016 (Approximate)   SpO2 98%   BMI 37.49 kg/m   No intake/output data recorded.  FHT:  FHR: 150 bpm, variability: moderate,  accelerations:  Present,  decelerations:  Present early, variable UC:   regular, every 1-3 minutes SVE:   Dilation: 8 Effacement (%): 90 Station: -1, 0 Exam by:: Varney Baas, RN SROM: 3810, light mec  Pitocin @ 8 mu/min  Labs: Lab Results  Component Value Date   WBC 7.9 07/26/2017   HGB 13.1 07/26/2017   HCT 37.8 07/26/2017   MCV 94.3 07/26/2017   PLT 183 07/26/2017    Assessment / Plan: 32 y.o. G1P0000 [redacted]w[redacted]d in active labor Induction of labor due to SUA,  progressing well on pitocin  Labor: Progressing normally, s/p SROM Fetal Wellbeing:  Category II Pain Control:  Epidural Anticipated MOD:  NSVD  Expectant management   Luiz Blare, DO OB Fellow Center for [facility], [facility]

## 2017-07-27 NOTE — Lactation Note (Signed)
This note was copied from a baby's chart. Lactation Consultation Note  Patient Name: Gloria Alvarado [id number] Date: 07/27/2017 Reason for consult: Initial assessment;Primapara;1st time breastfeeding;Nipple pain/trauma  Visited with P1 Mom of term baby at 5 hrs post delivery.  FOB called out for help with latching.  Baby latched per Mom on left breast twice so far.   Baby sucking on fingers, swaddled in blanket. Placed baby STS on Mom's chest.  Burped baby for a large burp. Mom with flat nipples, and compressible areola.  Mom shown how to hand express, colostrum easily flowing. Baby opening and closing mouth, but opened widely and with sandwiching of breast, able to attain a deep areolar latch.  Baby sleepy and slipped onto nipple. Slight reddened area noted over right nipple. Taught Mom how to identify a deep latch.   Baby placed on Mom's chest again, and Mom and baby to rest.  To call for assist when baby starts cueing again. Hand pump given. Mom knows she can hand express into colostrum bullet and feed to baby.  Lactation brochure given.  Mom aware of IP and OP lactation services.  LC to follow up later today. Consult Status Consult Status: Follow-up Date: 07/27/17 Follow-up type: In-patient    Broadus John 07/27/2017, 8:42 AM

## 2017-07-27 NOTE — Anesthesia Postprocedure Evaluation (Signed)
Anesthesia Post Note  Patient: Gloria Alvarado  Procedure(s) Performed: AN AD HOC LABOR EPIDURAL     Patient location during evaluation: Mother Baby Anesthesia Type: Epidural Level of consciousness: awake and alert, oriented and patient cooperative Pain management: pain level controlled Vital Signs Assessment: post-procedure vital signs reviewed and stable Respiratory status: spontaneous breathing Cardiovascular status: stable Postop Assessment: no headache, epidural receding, patient able to bend at knees and no signs of nausea or vomiting Anesthetic complications: no Comments: Pain score 1.    Last Vitals:  Vitals:   07/27/17 0430 07/27/17 0530  BP: 118/62 (!) 126/54  Pulse: 87 90  Resp: 18 18  Temp: 36.9 C 36.8 C  SpO2:      Last Pain:  Vitals:   07/27/17 0530  TempSrc: Oral  PainSc: 0-No pain   Pain Goal: Patients Stated Pain Goal: 4 (07/26/17 1559)               Rico Sheehan

## 2017-07-27 NOTE — Progress Notes (Signed)
Mother of baby was referred for history of depression and anxiety. Referral screened out by CSW because per chart review, MOB is actively taking 150mg  of Wellbutrin daily to address her symptoms.   Please contact CSW if mother of baby requests, if needs arise, or if mother of baby scores greater than a nine or answers yes to question ten on [place] Postpartum Depression Screen.   Madilyn Fireman, MSW, [place] Social Worker [facility] [phone]

## 2017-07-28 NOTE — Progress Notes (Addendum)
Patient ID: AVARY EICHENBERGER, female   DOB: 1985-02-26, 32 y.o.   MRN: [id number] POSTPARTUM PROGRESS NOTE  Post Partum Day 1 Subjective:  DELILIAH SPRANGER is a 32 y.o. G1P1001 [redacted]w[redacted]d s/p SVD following IOL d/t SUA.  No acute events overnight.  Pt denies problems with ambulating, voiding or po intake.  She denies nausea or vomiting.  Pain is well controlled. Lochia Minimal. Pt states she is feeling good just needs to get some rest. Requested to go home later today but pending babies release from the hospital.    Objective: Blood pressure (!) 118/92, pulse 99, temperature 97.6 F (36.4 C), temperature source Oral, resp. rate 18, height 5\' 5"  (1.651 m), weight 102.2 kg (225 lb 4.8 oz), last menstrual period 10/07/2016, SpO2 100 %, unknown if currently breastfeeding.  Physical Exam:  General: alert, cooperative and no distress Lochia:normal flow Chest: no respiratory distress Heart:regular rate, distal pulses intact Abdomen: soft, nontender,  Uterine Fundus: firm, appropriately tender DVT Evaluation: No calf swelling or tenderness Extremities: Mild pitting edema, same as before delivery  Recent Labs    07/26/17 0825  HGB 13.1  HCT 37.8    Assessment/Plan:  ASSESSMENT: Lessie L Hopkins is a 32 y.o. G1P1001 [redacted]w[redacted]d s/p SVD following IOL for SUA.  Plan for discharge tomorrow, Breastfeeding and Contraception Considering IUD but would like to decide at her 6 week appt.   LOS: 2 days   Sherene Sires, MS3 07/28/2017, 7:40 AM   OB FELLOW MEDICAL STUDENT NOTE ATTESTATION I confirm that I have verified the information documented in the medical student's note and that I have also personally performed the physical exam and all medical decision making activities.  Pt doing well. VS, physical exam wnl. D/c home on 7/16  Gailen Shelter, MD OB Fellow

## 2017-07-29 MED ORDER — IBUPROFEN 600 MG PO TABS: 600.0000 mg | ORAL_TABLET | Freq: Four times a day (QID) | ORAL | 0 refills | Status: DC

## 2017-07-29 MED ORDER — MEASLES, MUMPS & RUBELLA VAC ~~LOC~~ INJ
0.5000 mL | INJECTION | Freq: Once | SUBCUTANEOUS | Status: AC
  Administered 2017-07-29: 0.5 mL via SUBCUTANEOUS
  Filled 2017-07-29: qty 0.5

## 2017-07-29 NOTE — Discharge Summary (Signed)
OB Discharge Summary     Patient Name: Gloria Alvarado DOB: 1985/12/02 MRN: [id number]  Date of admission: 07/26/2017 Delivering MD: Katheren Shams   Date of discharge: 07/29/2017  Admitting diagnosis: induction Intrauterine pregnancy: [redacted]w[redacted]d     Secondary diagnosis:  Active Problems:   Single umbilical artery, maternal, antepartum   SVD (spontaneous vaginal delivery)  Additional problems: GBS positive. Hx of maternal depression, rubella non immune.      Discharge diagnosis: Term Pregnancy Delivered                                                                                                Post partum procedures:none  Augmentation: Pitocin, Cytotec and Foley Balloon  Complications: None  Hospital course:  Induction of Labor With Vaginal Delivery   32 y.o. yo G1P1001 at [redacted]w[redacted]d was admitted to the hospital 07/26/2017 for induction of labor.  Indication for induction: single umbilical artery. .  Patient had an uncomplicated labor course as follows: Membrane Rupture Time/Date: 12:58 AM ,07/27/2017   Intrapartum Procedures: Episiotomy: None [1]                                         Lacerations:  1st degree [2]  Patient had delivery of a Viable infant.  Information for the patient's newborn:  Nahomi, Hegner Girl Vlasta [[id number]]  Delivery Method: Vaginal, Spontaneous(Filed from Delivery Summary)   07/27/2017  Details of delivery can be found in separate delivery note.  Patient had a routine postpartum course. Patient is discharged home 07/29/17.  Physical exam  Vitals:   07/28/17 0625 07/28/17 1425 07/28/17 2300 07/29/17 0521  BP: (!) 118/92 (!) 141/73 118/76 120/83  Pulse:  (!) 102 91 80  Resp: 18 18 18 18   Temp: 97.6 F (36.4 C) 97.8 F (36.6 C) 98 F (36.7 C) 97.8 F (36.6 C)  TempSrc: Oral   Oral  SpO2:  100%    Weight:      Height:       General: alert, cooperative and no distress Lochia: appropriate Uterine Fundus: firm Incision: N/A DVT Evaluation: No evidence  of DVT seen on physical exam. Labs: Lab Results  Component Value Date   WBC 7.9 07/26/2017   HGB 13.1 07/26/2017   HCT 37.8 07/26/2017   MCV 94.3 07/26/2017   PLT 183 07/26/2017   CMP Latest Ref Rng & Units 04/17/2015  Glucose 65 - 99 mg/dL 74  BUN 6 - 20 mg/dL 8  Creatinine 0.57 - 1.00 mg/dL 0.72  Sodium 134 - 144 mmol/L 141  Potassium 3.5 - 5.2 mmol/L 4.2  Chloride 96 - 106 mmol/L 100  CO2 18 - 29 mmol/L 21  Calcium 8.7 - 10.2 mg/dL 9.3  Total Protein 6.0 - 8.5 g/dL 7.4  Total Bilirubin 0.0 - 1.2 mg/dL 0.6  Alkaline Phos 39 - 117 IU/L 75  AST 0 - 40 IU/L 18  ALT 0 - 32 IU/L 23    Discharge instruction: per After Visit Summary and "Baby and  Me Booklet".  After visit meds:  Allergies as of 07/29/2017      Reactions   Sulfa Antibiotics Shortness Of Breath, Swelling, Other (See Comments)   Throat swelling, shortness of breath.      Medication List    STOP taking these medications   acetaminophen 325 MG tablet Commonly known as:  TYLENOL   buPROPion 150 MG 12 hr tablet Commonly known as:  WELLBUTRIN SR   loratadine 10 MG tablet Commonly known as:  CLARITIN   multivitamin-prenatal 27-0.8 MG Tabs tablet   PROBIOTIC PO   ZANTAC PO     TAKE these medications   ibuprofen 600 MG tablet Commonly known as:  ADVIL,MOTRIN Take 1 tablet (600 mg total) by mouth every 6 (six) hours.       Diet: routine diet  Activity: Advance as tolerated. Pelvic rest for 6 weeks.   Outpatient follow up:6 weeks Follow up Appt: Future Appointments  Date Time Provider [place]  09/01/2017 11:00 AM Roma Schanz, CNM [facility]   Follow up Visit:No follow-ups on file.  Postpartum contraception: Undecided  Newborn Data: Live born female  Birth Weight: 8 lb 1.3 oz (3665 g) APGAR: 106, 9  Newborn Delivery   Birth date/time:  07/27/2017 02:25:00 Delivery type:  Vaginal, Spontaneous     Baby Feeding: Breast Disposition:home with  mother   07/29/2017 Benay Pike, MD

## 2017-07-29 NOTE — Lactation Note (Signed)
This note was copied from a baby's chart. Lactation Consultation Note  Patient Name: Girl Verlee Pope Today's Date: 07/29/2017   P1, Baby using #16NS on L side and #20NS on R side. Mother is using NS to help baby latch per mother and due to soreness. Mom has my # to call for assist w/next feeding. Recommend mother post pump 4-6 times per day for 10-20 min with DEBP on initiation setting. Give baby back volume pumped at the next feeding. Suggest trying to latch baby without NS halfway through feeding. Mom encouraged to feed baby 8-12 times/24 hours and with feeding cues.  Reviewed engorgement care and monitoring voids/stools.       Maternal Data    Feeding    LATCH Score                   Interventions    Lactation Tools Discussed/Used     Consult Status      Vivianne [facility] 07/29/2017, 10:23 AM

## 2017-07-29 NOTE — Lactation Note (Signed)
This note was copied from a baby's chart. Lactation Consultation Note Baby 26 hrs old. Cluster feeding.  Mom has flat nipples. Using #20 NS. Mom states NS will not stay on the Rt. Nipple. Mom has small nipples. Rt. Smaller than Lt. Fitted w/#16 NS. Lt, nipple has positional stripe and sore. #20 NS may feel better so will not rub.  Comfort gels given. Encouraged to wear shells, pre-pump before latching or application of NS. LC feels mom needs to be seen again before d.c home.  Patient Name: Gloria Alvarado [id number] Date: 07/29/2017 Reason for consult: Follow-up assessment   Maternal Data    Feeding Feeding Type: Breast Fed Length of feed: 25 min  LATCH Score Latch: Grasps breast easily, tongue down, lips flanged, rhythmical sucking.  Audible Swallowing: A few with stimulation  Type of Nipple: Flat  Comfort (Breast/Nipple): Filling, red/small blisters or bruises, mild/mod discomfort  Hold (Positioning): No assistance needed to correctly position infant at breast.  LATCH Score: 8  Interventions Interventions: Support pillows;Shells;Comfort gels;Hand pump;DEBP;Hand express;Breast compression;Pre-pump if needed  Lactation Tools Discussed/Used Tools: Comfort gels;Shells;Pump;Nipple Shields Nipple shield size: 16;20 Shell Type: Inverted Breast pump type: Double-Electric Breast Pump;Manual   Consult Status Consult Status: Follow-up Date: 07/29/17 Follow-up type: In-patient    Theodoro Kalata 07/29/2017, 2:31 AM

## 2017-07-29 NOTE — Discharge Instructions (Signed)
Vaginal Delivery, Care After °Refer to this sheet in the next few weeks. These instructions provide you with information about caring for yourself after vaginal delivery. Your health care provider may also give you more specific instructions. Your treatment has been planned according to current medical practices, but problems sometimes occur. Call your health care provider if you have any problems or questions. °What can I expect after the procedure? °After vaginal delivery, it is common to have: °· Some bleeding from your vagina. °· Soreness in your abdomen, your vagina, and the area of skin between your vaginal opening and your anus (perineum). °· Pelvic cramps. °· Fatigue. ° °Follow these instructions at home: °Medicines °· Take over-the-counter and prescription medicines only as told by your health care provider. °· If you were prescribed an antibiotic medicine, take it as told by your health care provider. Do not stop taking the antibiotic until it is finished. °Driving ° °· Do not drive or operate heavy machinery while taking prescription pain medicine. °· Do not drive for 24 hours if you received a sedative. °Lifestyle °· Do not drink alcohol. This is especially important if you are breastfeeding or taking medicine to relieve pain. °· Do not use tobacco products, including cigarettes, chewing tobacco, or e-cigarettes. If you need help quitting, ask your health care provider. °Eating and drinking °· Drink at least 8 eight-ounce glasses of water every day unless you are told not to by your health care provider. If you choose to breastfeed your baby, you may need to drink more water than this. °· Eat high-fiber foods every day. These foods may help prevent or relieve constipation. High-fiber foods include: °? Whole grain cereals and breads. °? Brown rice. °? Beans. °? Fresh fruits and vegetables. °Activity °· Return to your normal activities as told by your health care provider. Ask your health care provider  what activities are safe for you. °· Rest as much as possible. Try to rest or take a nap when your baby is sleeping. °· Do not lift anything that is heavier than your baby or 10 lb (4.5 kg) until your health care provider says that it is safe. °· Talk with your health care provider about when you can engage in sexual activity. This may depend on your: °? Risk of infection. °? Rate of healing. °? Comfort and desire to engage in sexual activity. °Vaginal Care °· If you have an episiotomy or a vaginal tear, check the area every day for signs of infection. Check for: °? More redness, swelling, or pain. °? More fluid or blood. °? Warmth. °? Pus or a bad smell. °· Do not use tampons or douches until your health care provider says this is safe. °· Watch for any blood clots that may pass from your vagina. These may look like clumps of dark red, brown, or black discharge. °General instructions °· Keep your perineum clean and dry as told by your health care provider. °· Wear loose, comfortable clothing. °· Wipe from front to back when you use the toilet. °· Ask your health care provider if you can shower or take a bath. If you had an episiotomy or a perineal tear during labor and delivery, your health care provider may tell you not to take baths for a certain length of time. °· Wear a bra that supports your breasts and fits you well. °· If possible, have someone help you with household activities and help care for your baby for at least a few days after   you leave the hospital. °· Keep all follow-up visits for you and your baby as told by your health care provider. This is important. °Contact a health care provider if: °· You have: °? Vaginal discharge that has a bad smell. °? Difficulty urinating. °? Pain when urinating. °? A sudden increase or decrease in the frequency of your bowel movements. °? More redness, swelling, or pain around your episiotomy or vaginal tear. °? More fluid or blood coming from your episiotomy or  vaginal tear. °? Pus or a bad smell coming from your episiotomy or vaginal tear. °? A fever. °? A rash. °? Little or no interest in activities you used to enjoy. °? Questions about caring for yourself or your baby. °· Your episiotomy or vaginal tear feels warm to the touch. °· Your episiotomy or vaginal tear is separating or does not appear to be healing. °· Your breasts are painful, hard, or turn red. °· You feel unusually sad or worried. °· You feel nauseous or you vomit. °· You pass large blood clots from your vagina. If you pass a blood clot from your vagina, save it to show to your health care provider. Do not flush blood clots down the toilet without having your health care provider look at them. °· You urinate more than usual. °· You are dizzy or light-headed. °· You have not breastfed at all and you have not had a menstrual period for 12 weeks after delivery. °· You have stopped breastfeeding and you have not had a menstrual period for 12 weeks after you stopped breastfeeding. °Get help right away if: °· You have: °? Pain that does not go away or does not get better with medicine. °? Chest pain. °? Difficulty breathing. °? Blurred vision or spots in your vision. °? Thoughts about hurting yourself or your baby. °· You develop pain in your abdomen or in one of your legs. °· You develop a severe headache. °· You faint. °· You bleed from your vagina so much that you fill two sanitary pads in one hour. °This information is not intended to replace advice given to you by your health care provider. Make sure you discuss any questions you have with your health care provider. °Document Released: 12/29/1999 Document Revised: 06/14/2015 Document Reviewed: 01/15/2015 °Elsevier Interactive Patient Education © 2018 [employer] ° °

## 2017-07-30 ENCOUNTER — Encounter: Payer: Self-pay | Admitting: Women's Health

## 2017-08-01 ENCOUNTER — Other Ambulatory Visit: Payer: Self-pay | Admitting: Women's Health

## 2017-08-01 MED ORDER — PREDNISONE 20 MG PO TABS: 40.0000 mg | ORAL_TABLET | Freq: Every day | ORAL | 0 refills | Status: DC

## 2017-09-01 ENCOUNTER — Ambulatory Visit (INDEPENDENT_AMBULATORY_CARE_PROVIDER_SITE_OTHER): Payer: 59 | Admitting: Women's Health

## 2017-09-01 ENCOUNTER — Encounter: Payer: Self-pay | Admitting: Women's Health

## 2017-09-01 DIAGNOSIS — L299 Pruritus, unspecified: Secondary | ICD-10-CM

## 2017-09-01 DIAGNOSIS — Z3202 Encounter for pregnancy test, result negative: Secondary | ICD-10-CM

## 2017-09-01 LAB — POCT URINE PREGNANCY: Preg Test, Ur: NEGATIVE

## 2017-09-01 MED ORDER — MEDROXYPROGESTERONE ACETATE 150 MG/ML IM SUSP: 150.0000 mg | INTRAMUSCULAR | 3 refills | Status: DC

## 2017-09-01 NOTE — Patient Instructions (Addendum)
Nothing to eat or drink after midnight tonight  For itching:  . Avoid hot showers/baths, take cool/luke-warm showers/baths instead . Cool washcloths to itchy areas . Aquaphor or Eucerin creams to moisturize the skin . Hydrocortisone or Benadryl cream, or Benadryl by mouth for the itching . Oatmeal baths  Medroxyprogesterone injection [Contraceptive] What is this medicine? MEDROXYPROGESTERONE (me DROX ee proe JES te rone) contraceptive injections prevent pregnancy. They provide effective birth control for 3 months. Depo-subQ Provera 104 is also used for treating pain related to endometriosis. This medicine may be used for other purposes; ask your health care provider or pharmacist if you have questions. COMMON BRAND NAME(S): Depo-Provera, Depo-subQ Provera 104 What should I tell my health care provider before I take this medicine? They need to know if you have any of these conditions: -frequently drink alcohol -asthma -blood vessel disease or a history of a blood clot in the lungs or legs -bone disease such as osteoporosis -breast cancer -diabetes -eating disorder (anorexia nervosa or bulimia) -high blood pressure -HIV infection or AIDS -kidney disease -liver disease -mental depression -migraine -seizures (convulsions) -stroke -tobacco smoker -vaginal bleeding -an unusual or allergic reaction to medroxyprogesterone, other hormones, medicines, foods, dyes, or preservatives -pregnant or trying to get pregnant -breast-feeding How should I use this medicine? Depo-Provera Contraceptive injection is given into a muscle. Depo-subQ Provera 104 injection is given under the skin. These injections are given by a health care professional. You must not be pregnant before getting an injection. The injection is usually given during the first 5 days after the start of a menstrual period or 6 weeks after delivery of a baby. Talk to your pediatrician regarding the use of this medicine in children.  Special care may be needed. These injections have been used in female children who have started having menstrual periods. Overdosage: If you think you have taken too much of this medicine contact a poison control center or emergency room at once. NOTE: This medicine is only for you. Do not share this medicine with others. What if I miss a dose? Try not to miss a dose. You must get an injection once every 3 months to maintain birth control. If you cannot keep an appointment, call and reschedule it. If you wait longer than 13 weeks between Depo-Provera contraceptive injections or longer than 14 weeks between Depo-subQ Provera 104 injections, you could get pregnant. Use another method for birth control if you miss your appointment. You may also need a pregnancy test before receiving another injection. What may interact with this medicine? Do not take this medicine with any of the following medications: -bosentan This medicine may also interact with the following medications: -aminoglutethimide -antibiotics or medicines for infections, especially rifampin, rifabutin, rifapentine, and griseofulvin -aprepitant -barbiturate medicines such as phenobarbital or primidone -bexarotene -carbamazepine -medicines for seizures like ethotoin, felbamate, oxcarbazepine, phenytoin, topiramate -modafinil -St. John's wort This list may not describe all possible interactions. Give your health care provider a list of all the medicines, herbs, non-prescription drugs, or dietary supplements you use. Also tell them if you smoke, drink alcohol, or use illegal drugs. Some items may interact with your medicine. What should I watch for while using this medicine? This drug does not protect you against HIV infection (AIDS) or other sexually transmitted diseases. Use of this product may cause you to lose calcium from your bones. Loss of calcium may cause weak bones (osteoporosis). Only use this product for more than 2 years if  other forms of birth control  are not right for you. The longer you use this product for birth control the more likely you will be at risk for weak bones. Ask your health care professional how you can keep strong bones. You may have a change in bleeding pattern or irregular periods. Many females stop having periods while taking this drug. If you have received your injections on time, your chance of being pregnant is very low. If you think you may be pregnant, see your health care professional as soon as possible. Tell your health care professional if you want to get pregnant within the next year. The effect of this medicine may last a long time after you get your last injection. What side effects may I notice from receiving this medicine? Side effects that you should report to your doctor or health care professional as soon as possible: -allergic reactions like skin rash, itching or hives, swelling of the face, lips, or tongue -breast tenderness or discharge -breathing problems -changes in vision -depression -feeling faint or lightheaded, falls -fever -pain in the abdomen, chest, groin, or leg -problems with balance, talking, walking -unusually weak or tired -yellowing of the eyes or skin Side effects that usually do not require medical attention (report to your doctor or health care professional if they continue or are bothersome): -acne -fluid retention and swelling -headache -irregular periods, spotting, or absent periods -temporary pain, itching, or skin reaction at site where injected -weight gain This list may not describe all possible side effects. Call your doctor for medical advice about side effects. You may report side effects to FDA at 1-800-FDA-1088. Where should I keep my medicine? This does not apply. The injection will be given to you by a health care professional. NOTE: This sheet is a summary. It may not cover all possible information. If you have questions about this  medicine, talk to your doctor, pharmacist, or health care provider.  2018 Elsevier/Gold Standard (2008-01-22 18:37:56)

## 2017-09-01 NOTE — Progress Notes (Signed)
POSTPARTUM VISIT Patient name: Gloria Alvarado MRN [id number]  Date of birth: 13-Jul-1985 Chief Complaint:   postpartum visit (interested in [place])  History of Present Illness:   Gloria Alvarado is a 32 y.o. G19P1001 Caucasian female being seen today for a postpartum visit. She is 5 weeks postpartum following a spontaneous vaginal delivery at 40.1 gestational weeks after IOL for SUA. Anesthesia: epidural. I have fully reviewed the prenatal and intrapartum course. Pregnancy complicated by SUA. Postpartum course has been uncomplicated. Bleeding period started 3d ago. Bowel function: has anal fissure that she thinks has opened back up- bleeding and pain w/ bm's, started stool softener and restarted cream she was given in past by GI doctor for same.  Bladder function is normal. Reports itching on inner arms, abd/back- worse at night, so bad leaves scabs from itching. States itching started on stomach the last week of pregnancy. Got MMR in hospital, developed itchy rash all over, rx'd prednisone 7/17 which helped a lot.  Patient is not sexually active. Last sexual activity: prior to birth of baby.  Contraception method is was interested in nuvaring, however is breastfeeding, discussed progestin-only methods, wants depo.  [place] Postpartum Depression Screening: negative. Score 4.   Last pap May 2016.  Results were normal .  No LMP recorded.  Baby's course has been uncomplicated. Baby is feeding by breast.  Review of Systems:   Pertinent items are noted in HPI Denies Abnormal vaginal discharge w/ itching/odor/irritation, headaches, visual changes, shortness of breath, chest pain, abdominal pain, severe nausea/vomiting, or problems with urination or bowel movements. Pertinent History Reviewed:  Reviewed past medical,surgical, obstetrical and family history.  Reviewed problem list, medications and allergies. OB History  Gravida Para Term Preterm AB Living  _0 0 0 1  SAB TAB Ectopic Multiple  Live Births  0 0 0 0 1    # Outcome Date GA Lbr Len/2nd Weight Sex Delivery Anes PTL Lv  1 Term 07/27/17 [id number] / 00:47 8 lb 1.3 oz (3.665 kg) F Vag-Spont EPI  LIV     Birth Comments: WNL   Physical Assessment:   Vitals:   09/01/17 1111  BP: 122/75  Pulse: 90  Weight: 205 lb 8 oz (93.2 kg)  Height: _1  (1.651 m)  Body mass index is 34.2 kg/m.       Physical Examination:   General appearance: alert, well appearing, and in no distress  Mental status: alert, oriented to person, place, and time  Skin: warm & dry; fine erythematous diffuse rash inner arms  Cardiovascular: normal heart rate noted   Respiratory: normal respiratory effort, no distress   Breasts: deferred, no complaints   Abdomen: soft, non-tender   Pelvic: VULVA: normal appearing vulva with no masses, tenderness or lesions, UTERUS: uterus is normal size, shape, consistency and nontender  Rectal: small external non-thrombosed hemorrhoids  Extremities: no edema       Results for orders placed or performed in visit on 09/01/17 (from the past 24 hour(s))  POCT urine pregnancy   Collection Time: 09/01/17 11:16 AM  Result Value Ref Range   Preg Test, Ur Negative Negative    Assessment & Plan:  1) Postpartum exam 2) 5 wks s/p SVB after IOL for SUA 3) Breastfeeding 4) Depression screening 5) Contraception counseling, pt prefers Depo-Provera injections  6) Generalized pruritus> will check fasting bile acids/CMP, gave list of relief measures 7) Anal fissure> continue stool softener, cream previously given by GI MD, if not helping let  us know  Meds:  Meds ordered this encounter  Medications  . medroxyPROGESTERone (DEPO-PROVERA) 150 MG/ML injection    Sig: Inject 1 mL (150 mg total) into the muscle every 3 (three) months.    Dispense:  1 mL    Refill:  3    Order Specific Question:   Supervising Provider    Answer:   Tania Ade H [[id number]]    Follow-up: Return for in the am for fasting labs and depo; then 1  mth for pap & physical.   Orders Placed This Encounter  Procedures  . Bile acids, total  . Comprehensive metabolic panel  . POCT urine pregnancy    [place], [facility] 09/01/2017 11:56 AM

## 2017-09-02 ENCOUNTER — Ambulatory Visit (INDEPENDENT_AMBULATORY_CARE_PROVIDER_SITE_OTHER): Payer: 59 | Admitting: *Deleted

## 2017-09-02 DIAGNOSIS — Z3042 Encounter for surveillance of injectable contraceptive: Secondary | ICD-10-CM | POA: Diagnosis not present

## 2017-09-02 MED ORDER — MEDROXYPROGESTERONE ACETATE 150 MG/ML IM SUSP
150.0000 mg | Freq: Once | INTRAMUSCULAR | Status: AC
  Administered 2017-09-02: 150 mg via INTRAMUSCULAR

## 2017-09-02 NOTE — Progress Notes (Signed)
Pt has negative pregnancy test. In to start depo provera. Had pp visit yesterday.  Depo Provera 150 mg given im in right deltoid.

## 2017-09-04 LAB — COMPREHENSIVE METABOLIC PANEL
ALK PHOS: 122 IU/L — AB (ref 39–117)
ALT: 74 IU/L — ABNORMAL HIGH (ref 0–32)
AST: 38 IU/L (ref 0–40)
Albumin/Globulin Ratio: 1.8 (ref 1.2–2.2)
Albumin: 4.4 g/dL (ref 3.5–5.5)
BUN/Creatinine Ratio: 17 (ref 9–23)
BUN: 15 mg/dL (ref 6–20)
Bilirubin Total: 0.8 mg/dL (ref 0.0–1.2)
CALCIUM: 9.5 mg/dL (ref 8.7–10.2)
CO2: 25 mmol/L (ref 20–29)
CREATININE: 0.89 mg/dL (ref 0.57–1.00)
Chloride: 102 mmol/L (ref 96–106)
GFR calc Af Amer: 99 mL/min/{1.73_m2} (ref >59)
GFR calc non Af Amer: 86 mL/min/{1.73_m2} (ref >59)
GLOBULIN, TOTAL: 2.4 g/dL (ref 1.5–4.5)
Glucose: 74 mg/dL (ref 65–99)
Potassium: 4.1 mmol/L (ref 3.5–5.2)
Sodium: 143 mmol/L (ref 134–144)
Total Protein: 6.8 g/dL (ref 6.0–8.5)

## 2017-09-04 LAB — BILE ACIDS, TOTAL: Bile Acids Total: 1.1 umol/L (ref 0.0–10.0)

## 2017-09-08 ENCOUNTER — Other Ambulatory Visit: Payer: Self-pay | Admitting: Women's Health

## 2017-09-08 DIAGNOSIS — R945 Abnormal results of liver function studies: Principal | ICD-10-CM

## 2017-09-08 DIAGNOSIS — R7989 Other specified abnormal findings of blood chemistry: Secondary | ICD-10-CM

## 2017-09-17 ENCOUNTER — Other Ambulatory Visit: Payer: 59

## 2017-09-18 ENCOUNTER — Other Ambulatory Visit: Payer: Self-pay | Admitting: Women's Health

## 2017-09-18 DIAGNOSIS — R740 Nonspecific elevation of levels of transaminase and lactic acid dehydrogenase [LDH]: Secondary | ICD-10-CM

## 2017-09-18 DIAGNOSIS — R7401 Elevation of levels of liver transaminase levels: Secondary | ICD-10-CM

## 2017-09-18 DIAGNOSIS — R7989 Other specified abnormal findings of blood chemistry: Secondary | ICD-10-CM

## 2017-09-18 LAB — COMPREHENSIVE METABOLIC PANEL
ALT: 43 IU/L — AB (ref 0–32)
AST: 22 IU/L (ref 0–40)
Albumin/Globulin Ratio: 1.6 (ref 1.2–2.2)
Albumin: 4.4 g/dL (ref 3.5–5.5)
Alkaline Phosphatase: 122 IU/L — ABNORMAL HIGH (ref 39–117)
BUN/Creatinine Ratio: 11 (ref 9–23)
BUN: 13 mg/dL (ref 6–20)
Bilirubin Total: 0.8 mg/dL (ref 0.0–1.2)
CO2: 23 mmol/L (ref 20–29)
Calcium: 9.4 mg/dL (ref 8.7–10.2)
Chloride: 105 mmol/L (ref 96–106)
Creatinine, Ser: 1.15 mg/dL — ABNORMAL HIGH (ref 0.57–1.00)
GFR, EST AFRICAN AMERICAN: 73 mL/min/{1.73_m2} (ref >59)
GFR, EST NON AFRICAN AMERICAN: 63 mL/min/{1.73_m2} (ref >59)
GLOBULIN, TOTAL: 2.7 g/dL (ref 1.5–4.5)
GLUCOSE: 73 mg/dL (ref 65–99)
POTASSIUM: 4 mmol/L (ref 3.5–5.2)
Sodium: 144 mmol/L (ref 134–144)
Total Protein: 7.1 g/dL (ref 6.0–8.5)

## 2017-09-24 ENCOUNTER — Other Ambulatory Visit: Payer: 59

## 2017-09-25 ENCOUNTER — Telehealth: Payer: Self-pay | Admitting: Women's Health

## 2017-09-25 DIAGNOSIS — R7989 Other specified abnormal findings of blood chemistry: Secondary | ICD-10-CM

## 2017-09-25 LAB — COMPREHENSIVE METABOLIC PANEL
A/G RATIO: 1.9 (ref 1.2–2.2)
ALT: 46 IU/L — AB (ref 0–32)
AST: 25 IU/L (ref 0–40)
Albumin: 4.5 g/dL (ref 3.5–5.5)
Alkaline Phosphatase: 120 IU/L — ABNORMAL HIGH (ref 39–117)
BUN/Creatinine Ratio: 13 (ref 9–23)
BUN: 14 mg/dL (ref 6–20)
Bilirubin Total: 0.9 mg/dL (ref 0.0–1.2)
CO2: 20 mmol/L (ref 20–29)
Calcium: 9.4 mg/dL (ref 8.7–10.2)
Chloride: 103 mmol/L (ref 96–106)
Creatinine, Ser: 1.08 mg/dL — ABNORMAL HIGH (ref 0.57–1.00)
GFR calc non Af Amer: 68 mL/min/{1.73_m2} (ref >59)
GFR, EST AFRICAN AMERICAN: 78 mL/min/{1.73_m2} (ref >59)
Globulin, Total: 2.4 g/dL (ref 1.5–4.5)
Glucose: 72 mg/dL (ref 65–99)
Potassium: 4.2 mmol/L (ref 3.5–5.2)
Sodium: 141 mmol/L (ref 134–144)
Total Protein: 6.9 g/dL (ref 6.0–8.5)

## 2017-09-25 NOTE — Telephone Encounter (Signed)
LM for pt to return call. Spoke w/ Dr. Elonda Husky, will get renal u/s and 24hr urine protein and creatinine clearance then refer to nephrologist.  Roma Schanz, CNM, WHNP-BC 09/25/2017 1:41 PM

## 2017-09-26 ENCOUNTER — Other Ambulatory Visit: Payer: Self-pay | Admitting: Women's Health

## 2017-09-26 MED ORDER — LIDOCAINE HCL URETHRAL/MUCOSAL 2 % EX GEL: 1.0000 "application " | CUTANEOUS | 3 refills | Status: DC | PRN

## 2017-09-29 ENCOUNTER — Other Ambulatory Visit: Payer: Self-pay | Admitting: *Deleted

## 2017-09-29 ENCOUNTER — Other Ambulatory Visit: Payer: Self-pay | Admitting: Women's Health

## 2017-09-29 DIAGNOSIS — R7401 Elevation of levels of liver transaminase levels: Secondary | ICD-10-CM

## 2017-09-29 DIAGNOSIS — R7989 Other specified abnormal findings of blood chemistry: Secondary | ICD-10-CM

## 2017-09-29 DIAGNOSIS — K7689 Other specified diseases of liver: Secondary | ICD-10-CM

## 2017-09-29 DIAGNOSIS — R740 Nonspecific elevation of levels of transaminase and lactic acid dehydrogenase [LDH]: Principal | ICD-10-CM

## 2017-10-01 ENCOUNTER — Other Ambulatory Visit: Payer: Self-pay | Admitting: Women's Health

## 2017-10-01 ENCOUNTER — Other Ambulatory Visit: Payer: 59 | Admitting: Women's Health

## 2017-10-01 ENCOUNTER — Ambulatory Visit (HOSPITAL_COMMUNITY)
Admission: RE | Admit: 2017-10-01 | Discharge: 2017-10-01 | Disposition: A | Discharge status: Home / Self Care | Payer: 59 | Source: Ambulatory Visit | Attending: Women's Health | Admitting: Women's Health

## 2017-10-01 DIAGNOSIS — R932 Abnormal findings on diagnostic imaging of liver and biliary tract: Secondary | ICD-10-CM | POA: Diagnosis not present

## 2017-10-01 DIAGNOSIS — R740 Nonspecific elevation of levels of transaminase and lactic acid dehydrogenase [LDH]: Secondary | ICD-10-CM | POA: Diagnosis not present

## 2017-10-01 DIAGNOSIS — R7989 Other specified abnormal findings of blood chemistry: Secondary | ICD-10-CM | POA: Diagnosis present

## 2017-10-01 DIAGNOSIS — R7401 Elevation of levels of liver transaminase levels: Secondary | ICD-10-CM

## 2017-10-01 DIAGNOSIS — K7689 Other specified diseases of liver: Secondary | ICD-10-CM

## 2017-10-02 LAB — CREATININE CLEARANCE, URINE, 24 HOUR
CREAT CLEAR: 123 mL/min (ref 88–128)
CREATININE, UR: 96 mg/dL
CREATININE: 0.92 mg/dL (ref 0.57–1.00)
Creatinine, 24H Ur: 1632 mg/24 hr (ref 800–1800)
GFR calc Af Amer: 95 mL/min/{1.73_m2} (ref >59)
GFR, EST NON AFRICAN AMERICAN: 83 mL/min/{1.73_m2} (ref >59)

## 2017-10-02 LAB — PROTEIN, URINE, 24 HOUR
Protein, 24H Urine: 116 mg/24 hr (ref 30–150)
Protein, Ur: 6.8 mg/dL

## 2017-10-06 ENCOUNTER — Encounter: Payer: Self-pay | Admitting: Family Medicine

## 2017-10-06 ENCOUNTER — Ambulatory Visit (INDEPENDENT_AMBULATORY_CARE_PROVIDER_SITE_OTHER): Payer: 59 | Admitting: Family Medicine

## 2017-10-06 DIAGNOSIS — K760 Fatty (change of) liver, not elsewhere classified: Secondary | ICD-10-CM | POA: Diagnosis not present

## 2017-10-06 DIAGNOSIS — R748 Abnormal levels of other serum enzymes: Secondary | ICD-10-CM | POA: Insufficient documentation

## 2017-10-06 DIAGNOSIS — R7989 Other specified abnormal findings of blood chemistry: Secondary | ICD-10-CM | POA: Insufficient documentation

## 2017-10-06 NOTE — Progress Notes (Signed)
   Subjective:    Patient ID: Gloria Alvarado, female    DOB: Oct 10, 1985, 32 y.o.   MRN: [id number]  HPI Pt here to discuss labs and ultrasound ordered by [facility].  Patient arrives as a new patient with a somewhat complicated medical history.  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  Has had elevated liver enzymes mild in nature, no alcohol use,  Had elevated creatinine, mild, GFR has always remained within normal limits.  Further work-up including a 24-hour urine creatinine clearance normal limits.  Last liver enzymes showed slight  Last renal function blood work showed slight improvement  Last ultrasound kidneys within normal limits and liver showed resolution of one apparent hemangioma and the other one at the constraints with element of hepato-steatosis     Review of Systems No headache, no major weight loss or weight gain, no chest pain no back pain abdominal pain no change in bowel habits complete ROS otherwise negative     Objective:   Physical Exam  Alert vitals stable, NAD. Blood pressure good on repeat. HEENT normal. Lungs clear. Heart regular rate and rhythm.       Assessment & Plan:  1 impression borderline elevation creatinine.  Coming by completely otherwise normal work-up with normal GFR, normal 24-hour urine creatinine clearance, 20 normal ultrasound.  Long discussion held in this regard.  I think at this point this screening creatinine and GFR annually will be appropriate.  2.  Elevated liver enzymes/mild/likely due to fatty liver.  Long-term medications discussed.  This too should be screened yearly.  Patient should work hard on trying to lose some weight rationale discussed  Follow-up was recommended

## 2017-10-13 ENCOUNTER — Other Ambulatory Visit (HOSPITAL_COMMUNITY)
Admission: RE | Admit: 2017-10-13 | Discharge: 2017-10-13 | Disposition: A | Discharge status: Home / Self Care | Payer: 59 | Source: Ambulatory Visit | Attending: Adult Health | Admitting: Adult Health

## 2017-10-13 ENCOUNTER — Encounter: Payer: Self-pay | Admitting: Adult Health

## 2017-10-13 ENCOUNTER — Other Ambulatory Visit: Payer: Self-pay

## 2017-10-13 ENCOUNTER — Ambulatory Visit (INDEPENDENT_AMBULATORY_CARE_PROVIDER_SITE_OTHER): Payer: 59 | Admitting: Adult Health

## 2017-10-13 VITALS — BP 113/65 | HR 93 | Ht 65.0 in | Wt 200.0 lb

## 2017-10-13 DIAGNOSIS — Z3042 Encounter for surveillance of injectable contraceptive: Secondary | ICD-10-CM

## 2017-10-13 DIAGNOSIS — Z01419 Encounter for gynecological examination (general) (routine) without abnormal findings: Secondary | ICD-10-CM

## 2017-10-13 NOTE — Progress Notes (Signed)
Patient ID: Gloria Alvarado, female   DOB: Jun 23, 1985, 32 y.o.   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   Current Medications, Allergies, Past Medical History, Past Surgical History, Family History and Social History were reviewed in [employer] record.     Review of Systems: Patient denies any headaches, hearing loss, fatigue, blurred vision, shortness of breath, chest pain, abdominal pain, problems with bowel movements, urination, or intercourse. No joint pain or mood swings. +bleeding some with depo.   Physical Exam:BP 113/65 (BP Location: Right Arm, Patient Position: Sitting, Cuff Size: Normal)   Pulse 93   Ht 5\' 5"  (1.651 m)   Wt 200 lb (90.7 kg)   Breastfeeding? Yes   BMI 33.28 kg/m  General:  Well developed, well nourished, no acute distress Skin:  Warm and dry Neck:  Midline trachea, normal thyroid, good ROM, no lymphadenopathy Lungs; Clear to auscultation bilaterally Breast:  No dominant palpable mass, retraction, or nipple discharge Cardiovascular: Regular rate and rhythm Abdomen:  Soft, non tender, no hepatosplenomegaly Pelvic:  External genitalia is normal in appearance, no lesions.  The vagina is normal in appearance. Urethra has no lesions or masses. The cervix is bulbous. Pap with HPV performed. Uterus is felt to be normal size, shape, and contour.  No adnexal masses or tenderness noted.Bladder is non tender, no masses felt. Extremities/musculoskeletal:  No swelling or varicosities noted, no clubbing or cyanosis Psych:  No mood changes, alert and cooperative,seems happy PHQ 2 score 0. Examination chaperoned by Shela Nevin RN.  Impression: 1. Encounter for gynecological examination with Papanicolaou smear of cervix   2. Encounter for surveillance of injectable  contraceptive       Plan: Physical in 1 year Pap in 3 if normal Depo as scheduled Continue Wellbutrin

## 2017-10-15 LAB — CYTOLOGY - PAP
Diagnosis: NEGATIVE
HPV: NOT DETECTED

## 2017-11-24 ENCOUNTER — Encounter: Payer: Self-pay | Admitting: *Deleted

## 2017-11-24 ENCOUNTER — Ambulatory Visit (INDEPENDENT_AMBULATORY_CARE_PROVIDER_SITE_OTHER): Payer: 59 | Admitting: *Deleted

## 2017-11-24 DIAGNOSIS — Z3202 Encounter for pregnancy test, result negative: Secondary | ICD-10-CM | POA: Diagnosis not present

## 2017-11-24 DIAGNOSIS — Z3042 Encounter for surveillance of injectable contraceptive: Secondary | ICD-10-CM | POA: Diagnosis not present

## 2017-11-24 DIAGNOSIS — Z308 Encounter for other contraceptive management: Secondary | ICD-10-CM

## 2017-11-24 LAB — POCT URINE PREGNANCY: Preg Test, Ur: NEGATIVE

## 2017-11-24 MED ORDER — MEDROXYPROGESTERONE ACETATE 150 MG/ML IM SUSP
150.0000 mg | Freq: Once | INTRAMUSCULAR | Status: AC
  Administered 2017-11-24: 150 mg via INTRAMUSCULAR

## 2017-11-24 NOTE — Progress Notes (Signed)
Pt here for Depo. Pt received shot in left deltoid. Pt tolerated shot well. Return in 12 weeks for next shot. Pt may not continue Depo but may switch to pills. Advised to call before Depo is due if she wants to switch to pills. Pt voiced understanding. [place]

## 2017-12-08 ENCOUNTER — Ambulatory Visit (INDEPENDENT_AMBULATORY_CARE_PROVIDER_SITE_OTHER): Payer: 59

## 2017-12-08 ENCOUNTER — Other Ambulatory Visit: Payer: Self-pay

## 2017-12-08 DIAGNOSIS — Z23 Encounter for immunization: Secondary | ICD-10-CM | POA: Diagnosis not present

## 2018-02-09 ENCOUNTER — Other Ambulatory Visit: Payer: Self-pay

## 2018-02-09 ENCOUNTER — Encounter: Payer: Self-pay | Admitting: Adult Health

## 2018-02-09 ENCOUNTER — Ambulatory Visit (INDEPENDENT_AMBULATORY_CARE_PROVIDER_SITE_OTHER): Payer: 59 | Admitting: Adult Health

## 2018-02-09 ENCOUNTER — Ambulatory Visit: Payer: 59 | Admitting: Adult Health

## 2018-02-09 VITALS — BP 113/77 | HR 91 | Ht 65.0 in | Wt 202.0 lb

## 2018-02-09 DIAGNOSIS — Z30011 Encounter for initial prescription of contraceptive pills: Secondary | ICD-10-CM | POA: Diagnosis not present

## 2018-02-09 MED ORDER — NORETHINDRONE ACET-ETHINYL EST 1-20 MG-MCG PO TABS: 1.0000 | ORAL_TABLET | Freq: Every day | ORAL | 11 refills | Status: DC

## 2018-02-09 NOTE — Progress Notes (Signed)
Patient ID: Gloria Alvarado, female   DOB: 1985-10-15, 33 y.o.   MRN: [id number] History of Present Illness: Gloria Alvarado is a 33 year old white female, married, has been on depo and pumping, has 20 month old and has about 2 months breast milk frozen and wants to go back on OCs and stop pumping.  Last depo 11/24/17. PCP Mickie Hillier.    Current Medications, Allergies, Past Medical History, Past Surgical History, Family History and Social History were reviewed in [employer] record.     Review of Systems: Has had bleeding for about 2 weeks Trying to stop pumping Wants to lose weight     Physical Exam:BP 113/77 (BP Location: Right Arm, Patient Position: Sitting, Cuff Size: Normal)   Pulse 91   Ht 5\' 5"  (1.651 m)   Wt 202 lb (91.6 kg)   Breastfeeding Yes   BMI 33.61 kg/m  General:  Well developed, well nourished, no acute distress Skin:  Warm and dry Lungs; Clear to auscultation bilaterally Cardiovascular: Regular rate and rhythm Psych:  No mood changes, alert and cooperative,seems happy Start cutting out 1-2 pumpings  during the day, and try not to empty the breast, wear tight bra. Will Rx Junel 1-20 can start Sunday.When totally stopped pumping, if wants adipex call.   Impression: 1. Encounter for initial prescription of contraceptive pills       Plan: Meds ordered this encounter  Medications  . norethindrone-ethinyl estradiol (MICROGESTIN,JUNEL,LOESTRIN) 1-20 MG-MCG tablet    Sig: Take 1 tablet by mouth daily.    Dispense:  1 Package    Refill:  11    Order Specific Question:   Supervising Provider    Answer:   Florian Buff [[id number]]  Start [employer] Decrease pumping F/U prn

## 2018-03-12 ENCOUNTER — Other Ambulatory Visit: Payer: Self-pay | Admitting: Adult Health

## 2018-03-12 MED ORDER — PHENTERMINE HCL 15 MG PO CAPS: 15.0000 mg | ORAL_CAPSULE | ORAL | 0 refills | Status: DC

## 2018-03-12 NOTE — Progress Notes (Signed)
rx phentermine 15 mg as trial

## 2018-04-01 DIAGNOSIS — Z029 Encounter for administrative examinations, unspecified: Secondary | ICD-10-CM

## 2018-04-06 ENCOUNTER — Ambulatory Visit: Payer: 59 | Admitting: Adult Health

## 2018-04-14 ENCOUNTER — Other Ambulatory Visit: Payer: Self-pay | Admitting: Adult Health

## 2018-04-17 ENCOUNTER — Telehealth: Payer: Self-pay | Admitting: *Deleted

## 2018-04-17 NOTE — Telephone Encounter (Signed)
Left message letting pt know Monday's visit will be telephone visit. Advised to expect a call from office close to her appt time. [place]

## 2018-04-20 ENCOUNTER — Ambulatory Visit (INDEPENDENT_AMBULATORY_CARE_PROVIDER_SITE_OTHER): Payer: 59 | Admitting: Adult Health

## 2018-04-20 ENCOUNTER — Encounter: Payer: Self-pay | Admitting: Adult Health

## 2018-04-20 ENCOUNTER — Other Ambulatory Visit: Payer: Self-pay

## 2018-04-20 VITALS — BP 117/75 | HR 76 | Ht 65.0 in | Wt 196.0 lb

## 2018-04-20 DIAGNOSIS — Z6832 Body mass index (BMI) 32.0-32.9, adult: Secondary | ICD-10-CM | POA: Diagnosis not present

## 2018-04-20 DIAGNOSIS — Z713 Dietary counseling and surveillance: Secondary | ICD-10-CM | POA: Diagnosis not present

## 2018-04-20 MED ORDER — PHENTERMINE HCL 15 MG PO CAPS: 15.0000 mg | ORAL_CAPSULE | ORAL | 0 refills | Status: DC

## 2018-04-20 NOTE — Progress Notes (Signed)
Patient ID: CRICKETT ABBETT, female   DOB: 1985-06-10, 33 y.o.   MRN: [id number]   TELEHEALTH VIRTUAL GYNECOLOGY VISIT ENCOUNTER NOTE  I connected with Karle Plumber on 04/20/18 at 10:00 AM EDT by telephone at home and verified that I am speaking with the correct person using two identifiers.   I discussed the limitations, risks, security and privacy concerns of performing an evaluation and management service by telephone and the availability of in person appointments. I also discussed with the patient that there may be a patient responsible charge related to this service. The patient expressed understanding and agreed to proceed.   History:  JAILEE JAQUEZ is a 33 y.o. G52P1001 female being evaluated today for weight loss, has been on adipex since 03/12/18. She denies any problems or concerns. 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  She has done some intermittent fasting too.      Past Medical History:  Diagnosis Date  . Abdominal pain 04/17/2015  . Back pain 06/05/2015  . Contraceptive management 05/03/2013  . Depression 04/17/2015  . Dizzy spells 04/17/2015  . Gas 04/17/2015  . Hemangioma   . Nausea 04/17/2015  . Shortness of breath 04/17/2015   Past Surgical History:  Procedure Laterality Date  . COLONOSCOPY N/A 07/27/2015   Procedure: COLONOSCOPY;  Surgeon: Rogene Houston, MD;  [facility];  Service: Endoscopy;  Laterality: N/A;  225 - moved to 1:30 - Ann notified pt  . WISDOM TOOTH EXTRACTION     The following portions of the patient's history were reviewed and updated as appropriate: allergies, current medications, past family history, past medical history, past social history, past surgical history and problem list.   Health Maintenance:  Normal pap with negative HPV on 10/03/2017.   Review of Systems:  Pertinent items noted in HPI and remainder of comprehensive ROS otherwise negative.  Physical Exam:   General:  Alert, oriented  and cooperative.   Mental Status: Normal mood and affect perceived. Normal judgment and thought content.  Physical exam deferred due to nature of the encounter BP 117/75 (BP Location: Left Arm, Patient Position: Sitting, Cuff Size: Normal)   Pulse 76   Ht 5\' 5"  (1.651 m)   Wt 196 lb (88.9 kg)   BMI 32.62 kg/m  per pt.She has checked BP and weight at home.  Labs and Imaging No results found for this or any previous visit (from the past 336 hour(s)). No results found.    Assessment and Plan:     1. Encounter for weight loss counseling      Will continue adipex 15 mg   2. Body mass index 32.0-32.9, adult    Meds ordered this encounter  Medications  . phentermine 15 MG capsule    Sig: Take 1 capsule (15 mg total) by mouth every morning.    Dispense:  30 capsule    Refill:  0    Order Specific Question:   Supervising Provider    Answer:   Elonda Husky, LUTHER H [[id number]]  F/U in 4 weeks by phone      I discussed the assessment and treatment plan with the patient. The patient was provided an opportunity to ask questions and all were answered. The patient agreed with the plan and demonstrated an understanding of the instructions.   The patient was advised to call back or seek an in-person evaluation/go to the ED if the symptoms worsen  or if the condition fails to improve as anticipated.  I provided  5 minutes of non-face-to-face time during this encounter.   Derrek Monaco, NP Center for [facility], [place]

## 2018-04-27 ENCOUNTER — Other Ambulatory Visit: Payer: Self-pay | Admitting: Adult Health

## 2018-04-27 MED ORDER — PHENTERMINE HCL 15 MG PO TBDP: 1.0000 | ORAL_TABLET | Freq: Every day | ORAL | 0 refills | Status: DC

## 2018-04-27 NOTE — Progress Notes (Signed)
Will rx phentermine tablet

## 2018-05-01 ENCOUNTER — Other Ambulatory Visit: Payer: Self-pay | Admitting: Adult Health

## 2018-05-01 MED ORDER — PHENTERMINE HCL 30 MG PO TBDP: ORAL_TABLET | ORAL | 0 refills | Status: DC

## 2018-05-01 NOTE — Progress Notes (Signed)
Will rx phentermine 30 mg take 1/2 tab daily

## 2018-05-01 NOTE — Telephone Encounter (Signed)
[facility] calling ans states the typ that was sent in they do not have and want to see if its ok to change to the regular tablets or capsules that do not dissolve?

## 2018-05-08 ENCOUNTER — Telehealth: Payer: Self-pay | Admitting: Adult Health

## 2018-05-08 NOTE — Telephone Encounter (Signed)
Gloria Alvarado and appointment on May 4 at 11:30/ her daughter has appointment at 10:30 with [facility].  I didn't know if she needed to be in office or webex/  Just let me know and I can make a note and let her know

## 2018-05-11 ENCOUNTER — Ambulatory Visit: Payer: 59 | Admitting: Adult Health

## 2018-05-18 ENCOUNTER — Ambulatory Visit: Payer: 59 | Admitting: Adult Health

## 2018-05-18 ENCOUNTER — Encounter: Payer: Self-pay | Admitting: *Deleted

## 2018-05-19 ENCOUNTER — Other Ambulatory Visit: Payer: Self-pay

## 2018-05-19 ENCOUNTER — Ambulatory Visit (INDEPENDENT_AMBULATORY_CARE_PROVIDER_SITE_OTHER): Payer: 59 | Admitting: Adult Health

## 2018-05-19 ENCOUNTER — Encounter: Payer: Self-pay | Admitting: Adult Health

## 2018-05-19 VITALS — BP 119/81 | Ht 65.0 in | Wt 200.0 lb

## 2018-05-19 DIAGNOSIS — Z713 Dietary counseling and surveillance: Secondary | ICD-10-CM

## 2018-05-19 DIAGNOSIS — Z6833 Body mass index (BMI) 33.0-33.9, adult: Secondary | ICD-10-CM | POA: Diagnosis not present

## 2018-05-19 MED ORDER — PHENTERMINE HCL 37.5 MG PO TABS: 37.5000 mg | ORAL_TABLET | Freq: Every day | ORAL | 0 refills | Status: DC

## 2018-05-19 NOTE — Progress Notes (Signed)
Patient ID: Gloria Alvarado, female   DOB: [birth year], 33 y.o.   MRN: [id number]   TELEHEALTH VIRTUAL GYNECOLOGY VISIT ENCOUNTER NOTE  I connected with Karle Plumber on 05/19/18 at 11:00 AM EDT by telephone at home and verified that I am speaking with the correct person using two identifiers.   I discussed the limitations, risks, security and privacy concerns of performing an evaluation and management service by telephone and the availability of in person appointments. I also discussed with the patient that there may be a patient responsible charge related to this service. The patient expressed understanding and agreed to proceed.   History:  [name] is a 33 y.o. G24P1001 female being evaluated today for weight loss, and she gained 4 lbs, only took meds about 2 weeks. She denies any headaches, or other concerns.       Past Medical History:  Diagnosis Date  . Abdominal pain 04/17/2015  . Back pain 06/05/2015  . Contraceptive management 05/03/2013  . Depression 04/17/2015  . Dizzy spells 04/17/2015  . Gas 04/17/2015  . Hemangioma   . Nausea 04/17/2015  . Shortness of breath 04/17/2015   Past Surgical History:  Procedure Laterality Date  . COLONOSCOPY N/A 07/27/2015   Procedure: COLONOSCOPY;  Surgeon: Rogene Houston, MD;  [facility];  Service: Endoscopy;  Laterality: N/A;  225 - moved to 1:30 - Ann notified pt  . WISDOM TOOTH EXTRACTION     The following portions of the patient's history were reviewed and updated as appropriate: allergies, current medications, past family history, past medical history, past social history, past surgical history and problem list.   Health Maintenance:  Normal pap and negative HRHPV on 10/13/17.  Review of Systems:  Pertinent items noted in HPI and remainder of comprehensive ROS otherwise negative.  Physical Exam:   General:  Alert, oriented and cooperative.   Mental Status: Normal mood and affect perceived. Normal judgment and thought  content.  Physical exam deferred due to nature of the encounter  Labs and Imaging No results found for this or any previous visit (from the past 336 hour(s)). No results found.    Assessment and Plan:     1. Encounter for weight loss counseling Will increase dose to 37.5 mg Meds ordered this encounter  Medications  . phentermine (ADIPEX-P) 37.5 MG tablet    Sig: Take 1 tablet (37.5 mg total) by mouth daily before breakfast.    Dispense:  30 tablet    Refill:  0    Order Specific Question:   Supervising Provider    Answer:   Elonda Husky, LUTHER H [[id number]]    2. Body mass index 33.0-33.9, adult -try fasting some too -be aware of when eating -increase water  Follow up in 4 weeks        I discussed the assessment and treatment plan with the patient. The patient was provided an opportunity to ask questions and all were answered. The patient agreed with the plan and demonstrated an understanding of the instructions.   The patient was advised to call back or seek an in-person evaluation/go to the ED if the symptoms worsen or if the condition fails to improve as anticipated.  I provided  5 minutes of non-face-to-face time during this encounter.   Derrek Monaco, NP Center for [facility], [place]

## 2018-06-04 ENCOUNTER — Other Ambulatory Visit: Payer: Self-pay | Admitting: Adult Health

## 2018-06-04 MED ORDER — NORETHINDRONE ACET-ETHINYL EST 1-20 MG-MCG PO TABS: 1.0000 | ORAL_TABLET | Freq: Every day | ORAL | 11 refills | Status: DC

## 2018-06-04 NOTE — Progress Notes (Signed)
Refilled OCs 

## 2018-06-12 ENCOUNTER — Encounter: Payer: Self-pay | Admitting: *Deleted

## 2018-06-15 ENCOUNTER — Ambulatory Visit (INDEPENDENT_AMBULATORY_CARE_PROVIDER_SITE_OTHER): Payer: 59 | Admitting: Adult Health

## 2018-06-15 ENCOUNTER — Encounter: Payer: Self-pay | Admitting: Adult Health

## 2018-06-15 ENCOUNTER — Other Ambulatory Visit: Payer: Self-pay

## 2018-06-15 VITALS — BP 115/75 | Ht 65.0 in | Wt 191.0 lb

## 2018-06-15 DIAGNOSIS — Z6831 Body mass index (BMI) 31.0-31.9, adult: Secondary | ICD-10-CM

## 2018-06-15 DIAGNOSIS — Z713 Dietary counseling and surveillance: Secondary | ICD-10-CM | POA: Diagnosis not present

## 2018-06-15 MED ORDER — PHENTERMINE HCL 37.5 MG PO TABS: 37.5000 mg | ORAL_TABLET | Freq: Every day | ORAL | 0 refills | Status: DC

## 2018-06-15 NOTE — Progress Notes (Signed)
Patient ID: Gloria Alvarado, female   DOB: 1985-07-03, 33 y.o.   MRN: [id number]   TELEHEALTH VIRTUAL GYNECOLOGY VISIT ENCOUNTER NOTE  I connected with Gloria Alvarado on 06/15/18 at 10:00 AM EDT by telephone at home and verified that I am speaking with the correct person using two identifiers.   I discussed the limitations, risks, security and privacy concerns of performing an evaluation and management service by telephone and the availability of in person appointments. I also discussed with the patient that there may be a patient responsible charge related to this service. The patient expressed understanding and agreed to proceed.   History:  Gloria Alvarado is a 33 y.o. G56P1001 female being evaluated today for weight loss, and has lost 9 lbs since 05/19/18 visit, is fasting some and is back at work, so can't eat as often. She denies any unusual headaches,not sleeping great, but feels not meds related, and no other concerns.       Past Medical History:  Diagnosis Date  . Abdominal pain 04/17/2015  . Back pain 06/05/2015  . Contraceptive management 05/03/2013  . Depression 04/17/2015  . Dizzy spells 04/17/2015  . Gas 04/17/2015  . Hemangioma   . Nausea 04/17/2015  . Shortness of breath 04/17/2015   Past Surgical History:  Procedure Laterality Date  . COLONOSCOPY N/A 07/27/2015   Procedure: COLONOSCOPY;  Surgeon: Rogene Houston, MD;  Location: [facility];  Service: Endoscopy;  Laterality: N/A;  225 - moved to 1:30 - Ann notified pt  . WISDOM TOOTH EXTRACTION     The following portions of the patient's history were reviewed and updated as appropriate: allergies, current medications, past family history, past medical history, past social history, past surgical history and problem list.   Health Maintenance:  Normal pap and negative HRHPV on 10/13/17.  Review of Systems:  Pertinent items noted in HPI and remainder of comprehensive ROS otherwise negative.  Physical Exam:   General:  Alert,  oriented and cooperative.   Mental Status: Normal mood and affect perceived. Normal judgment and thought content.  Physical exam deferred due to nature of the encounter BP 115/75   Ht 5\' 5"  (1.651 m)   Wt 191 lb (86.6 kg)   BMI 31.78 kg/m per pt Fall risk is low.PHQ 2 score 0.   Labs and Imaging No results found for this or any previous visit (from the past 336 hour(s)). No results found.    Assessment and Plan:     1. Encounter for weight loss counseling -continue intermittent fasting -increase water   2. Body mass index 31.0-31.9, adult -will continue adipex. Meds ordered this encounter  Medications  . phentermine (ADIPEX-P) 37.5 MG tablet    Sig: Take 1 tablet (37.5 mg total) by mouth daily before breakfast.    Dispense:  30 tablet    Refill:  0    Order Specific Question:   Supervising Provider    Answer:   Florian Buff [[id number]]  Will Follow up with tele visit in 4 weeks        I discussed the assessment and treatment plan with the patient. The patient was provided an opportunity to ask questions and all were answered. The patient agreed with the plan and demonstrated an understanding of the instructions.   The patient was advised to call back or seek an in-person evaluation/go to the ED if the symptoms worsen or if the condition fails to improve as anticipated.  I provided 6 minutes  of non-face-to-face time during this encounter.   Derrek Monaco, NP [facility] for [facility], [place]

## 2018-07-13 ENCOUNTER — Other Ambulatory Visit: Payer: Self-pay

## 2018-07-13 ENCOUNTER — Encounter: Payer: Self-pay | Admitting: Adult Health

## 2018-07-13 ENCOUNTER — Ambulatory Visit (INDEPENDENT_AMBULATORY_CARE_PROVIDER_SITE_OTHER): Payer: 59 | Admitting: Adult Health

## 2018-07-13 VITALS — BP 127/78 | HR 80 | Ht 65.0 in | Wt 185.0 lb

## 2018-07-13 DIAGNOSIS — Z6830 Body mass index (BMI) 30.0-30.9, adult: Secondary | ICD-10-CM | POA: Diagnosis not present

## 2018-07-13 DIAGNOSIS — Z713 Dietary counseling and surveillance: Secondary | ICD-10-CM

## 2018-07-13 MED ORDER — PHENTERMINE HCL 37.5 MG PO TABS: 37.5000 mg | ORAL_TABLET | Freq: Every day | ORAL | 0 refills | Status: DC

## 2018-07-13 NOTE — Progress Notes (Signed)
Patient ID: Gloria Alvarado, female   DOB: [birth year], 33 y.o.   MRN: [id number]   TELEHEALTH VIRTUAL GYNECOLOGY VISIT ENCOUNTER NOTE  I connected with Karle Plumber on 07/13/18 at 10:00 AM EDT by telephone at home and verified that I am speaking with the correct person using two identifiers.   I discussed the limitations, risks, security and privacy concerns of performing an evaluation and management service by telephone and the availability of in person appointments. I also discussed with the patient that there may be a patient responsible charge related to this service. The patient expressed understanding and agreed to proceed.   History:  Gloria PERRIELLO is a 33 y.o. G1P1001 white female,maaried, being evaluated today for weight loss on adipex, has lost about 11 lbs total. . She denies any headaches, sleep disturbance, or other concerns.  She is feel good, and just wants to get back in clothes before pregnancy.  PCP is Mickie Hillier.      Past Medical History:  Diagnosis Date  . Abdominal pain 04/17/2015  . Back pain 06/05/2015  . Contraceptive management 05/03/2013  . Depression 04/17/2015  . Dizzy spells 04/17/2015  . Gas 04/17/2015  . Hemangioma   . Nausea 04/17/2015  . Shortness of breath 04/17/2015   Past Surgical History:  Procedure Laterality Date  . COLONOSCOPY N/A 07/27/2015   Procedure: COLONOSCOPY;  Surgeon: Rogene Houston, MD;  [facility];  Service: Endoscopy;  Laterality: N/A;  225 - moved to 1:30 - Ann notified pt  . WISDOM TOOTH EXTRACTION     The following portions of the patient's history were reviewed and updated as appropriate: allergies, current medications, past family history, past medical history, past social history, past surgical history and problem list.   Health Maintenance:  Normal pap and negative HRHPV on 10/13/17.  Review of Systems:  Pertinent items noted in HPI and remainder of comprehensive ROS otherwise negative.  Physical Exam:   General:   Alert, oriented and cooperative.   Mental Status: Normal mood and affect perceived. Normal judgment and thought content.  Physical exam deferred due to nature of the encounter BP 127/78 (BP Location: Left Arm, Patient Position: Sitting, Cuff Size: Normal)   Pulse 80   Ht 5\' 5"  (1.651 m)   Wt 185 lb (83.9 kg)   LMP 06/27/2018 (Approximate)   Breastfeeding No   BMI 30.79 kg/m per pt. Fall risk is low.   Labs and Imaging No results found for this or any previous visit (from the past 336 hour(s)). No results found.    Assessment and Plan:     1. Encounter for weight loss counseling -will continue adipex -continue watching portions and increase water Meds ordered this encounter  Medications  . phentermine (ADIPEX-P) 37.5 MG tablet    Sig: Take 1 tablet (37.5 mg total) by mouth daily before breakfast.    Dispense:  30 tablet    Refill:  0    Order Specific Question:   Supervising Provider    Answer:   Elonda Husky, LUTHER H [[id number]]    2. Body mass index 30.0-30.9, adult        I discussed the assessment and treatment plan with the patient. The patient was provided an opportunity to ask questions and all were answered. The patient agreed with the plan and demonstrated an understanding of the instructions.   The patient was advised to call back or seek an in-person evaluation/go to the ED if the symptoms worsen or  if the condition fails to improve as anticipated.  I provided 6 minutes of non-face-to-face time during this encounter.   Derrek Monaco, NP [facility], [place]

## 2018-07-20 ENCOUNTER — Other Ambulatory Visit: Payer: Self-pay | Admitting: Adult Health

## 2018-08-10 ENCOUNTER — Ambulatory Visit (INDEPENDENT_AMBULATORY_CARE_PROVIDER_SITE_OTHER): Payer: 59 | Admitting: Adult Health

## 2018-08-10 ENCOUNTER — Encounter: Payer: Self-pay | Admitting: Adult Health

## 2018-08-10 ENCOUNTER — Other Ambulatory Visit: Payer: Self-pay

## 2018-08-10 VITALS — BP 117/77 | HR 82 | Ht 65.0 in | Wt 179.0 lb

## 2018-08-10 DIAGNOSIS — Z713 Dietary counseling and surveillance: Secondary | ICD-10-CM

## 2018-08-10 DIAGNOSIS — Z6829 Body mass index (BMI) 29.0-29.9, adult: Secondary | ICD-10-CM

## 2018-08-10 MED ORDER — PHENTERMINE HCL 37.5 MG PO TABS: 37.5000 mg | ORAL_TABLET | Freq: Every day | ORAL | 0 refills | Status: DC

## 2018-08-10 NOTE — Progress Notes (Signed)
Patient ID: Gloria Alvarado, female   DOB: [birth year], 33 y.o.   MRN: [id number]   TELEHEALTH VIRTUAL GYNECOLOGY VISIT ENCOUNTER NOTE  I connected with Gloria Alvarado on 08/10/18 at 10:00 AM EDT by telephone at home and verified that I am speaking with the correct person using two identifiers.   I discussed the limitations, risks, security and privacy concerns of performing an evaluation and management service by telephone and the availability of in person appointments. I also discussed with the patient that there may be a patient responsible charge related to this service. The patient expressed understanding and agreed to proceed.   History:  Gloria Alvarado is a 33 y.o. G1P1001,white female,married, being evaluated today for weight loss and has lost 6 more lbs for 17 total and feels good and can tell in clothes . She denies any headaches, not sleeping  or other concerns.   PCP is Mickie Hillier.      Past Medical History:  Diagnosis Date  . Abdominal pain 04/17/2015  . Back pain 06/05/2015  . Contraceptive management 05/03/2013  . Depression 04/17/2015  . Dizzy spells 04/17/2015  . Gas 04/17/2015  . Hemangioma   . Nausea 04/17/2015  . Shortness of breath 04/17/2015   Past Surgical History:  Procedure Laterality Date  . COLONOSCOPY N/A 07/27/2015   Procedure: COLONOSCOPY;  Surgeon: Rogene Houston, MD;  [facility];  Service: Endoscopy;  Laterality: N/A;  225 - moved to 1:30 - Ann notified pt  . WISDOM TOOTH EXTRACTION     The following portions of the patient's history were reviewed and updated as appropriate: allergies, current medications, past family history, past medical history, past social history, past surgical history and problem list.   Health Maintenance:  Normal pap and negative HRHPV on 10/13/17  Review of Systems:  Pertinent items noted in HPI and remainder of comprehensive ROS otherwise negative.  Physical Exam:   General:  Alert, oriented and cooperative.   Mental  Status: Normal mood and affect perceived. Normal judgment and thought content.  Physical exam deferred due to nature of the encounter  Labs and Imaging No results found for this or any previous visit (from the past 336 hour(s)). No results found.    Assessment and Plan:     1. Encounter for weight loss counseling Can try taking adipex every other day, and then take a break to see if can continue weight loss on her own, she is still fasting some Will rx adipex  Meds ordered this encounter  Medications  . phentermine (ADIPEX-P) 37.5 MG tablet    Sig: Take 1 tablet (37.5 mg total) by mouth daily before breakfast.    Dispense:  30 tablet    Refill:  0    Order Specific Question:   Supervising Provider    Answer:   Elonda Husky, LUTHER H [[id number]]    2. Body mass index 29.0-29.9, adult        I discussed the assessment and treatment plan with the patient. The patient was provided an opportunity to ask questions and all were answered. The patient agreed with the plan and demonstrated an understanding of the instructions.   The patient was advised to call back or seek an in-person evaluation/go to the ED if the symptoms worsen or if the condition fails to improve as anticipated.  I provided 7 minutes of non-face-to-face time during this encounter.   Derrek Monaco, NP [facility], [place]

## 2018-11-08 IMAGING — US US ABDOMEN COMPLETE
1 series · 13 of 25 positions shown · non-contrast
Comparison: Limited ultrasound the abdomen of 07/15/2016

CLINICAL DATA: Follow-up of liver hemangiomas, abnormal renal
laboratory values, former smoking history elevated creatinine,
evaluate liver, elevated ALT

EXAM:
ABDOMEN ULTRASOUND COMPLETE

[Series 1: us abdomen complete · 0.21mm/px · 13 of 117 slices shown]
[im 1/117]
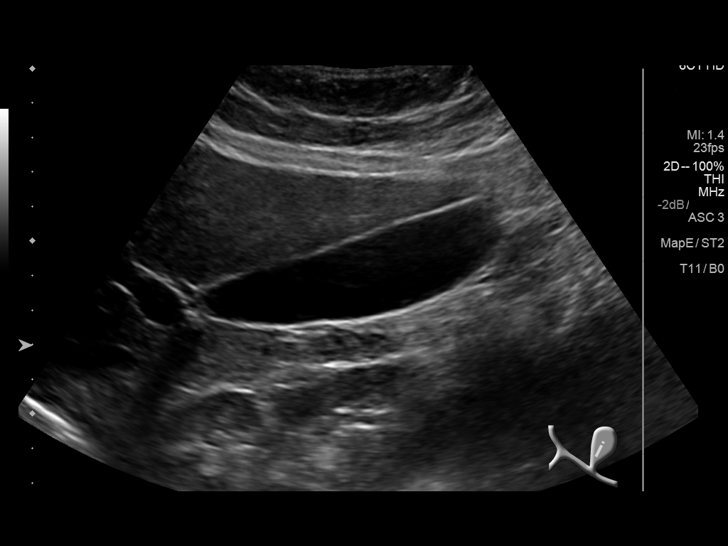
[im 10/117]
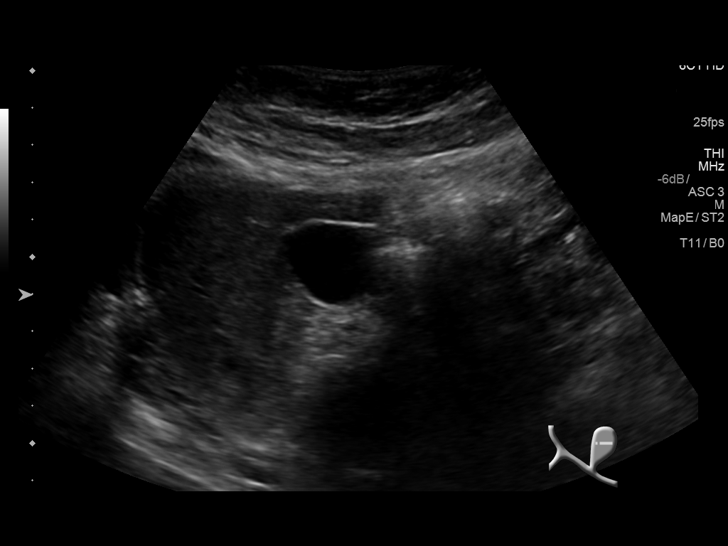
[im 20/117]
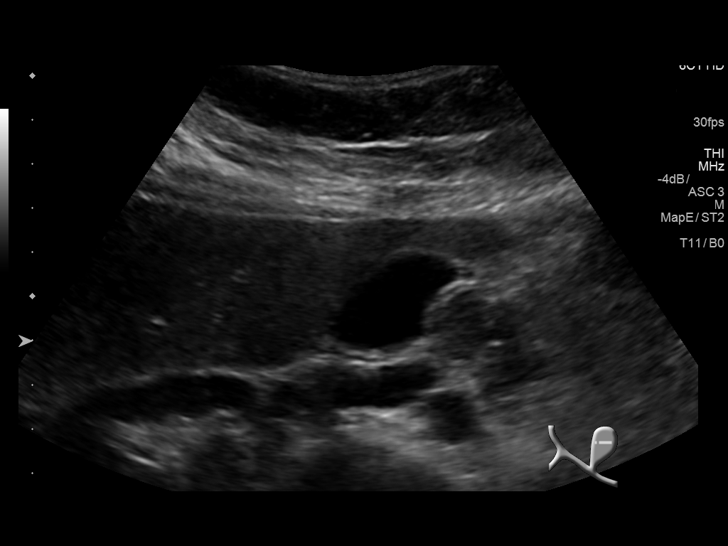
[im 30/117]
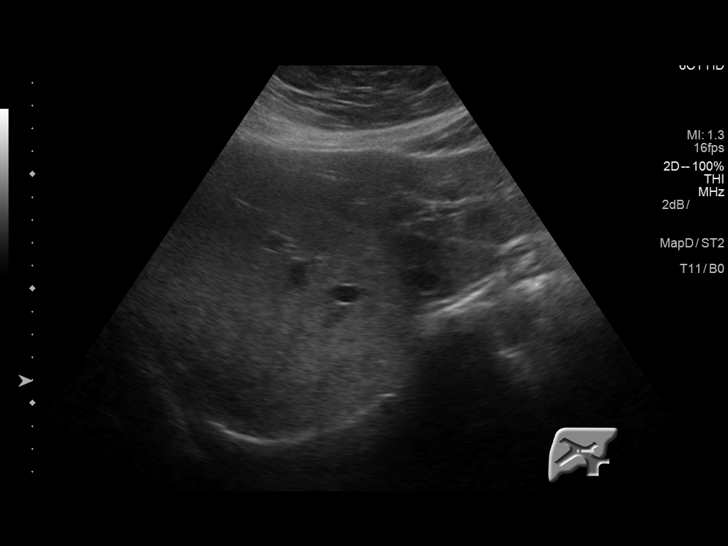
[im 39/117]
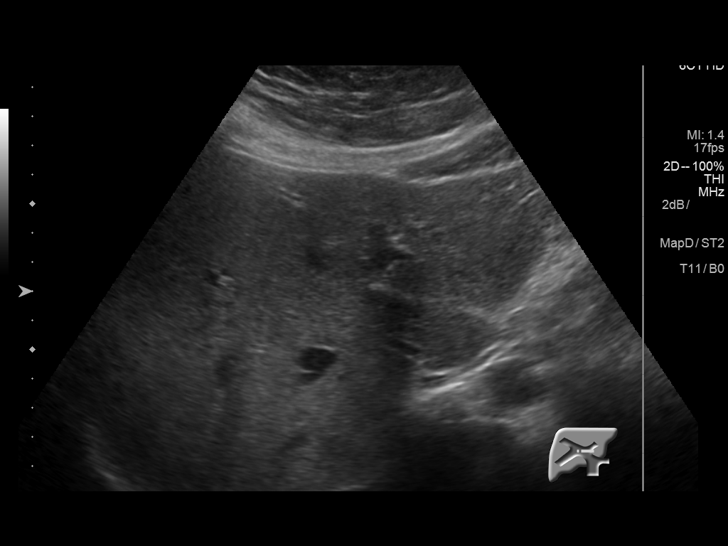
[im 49/117]
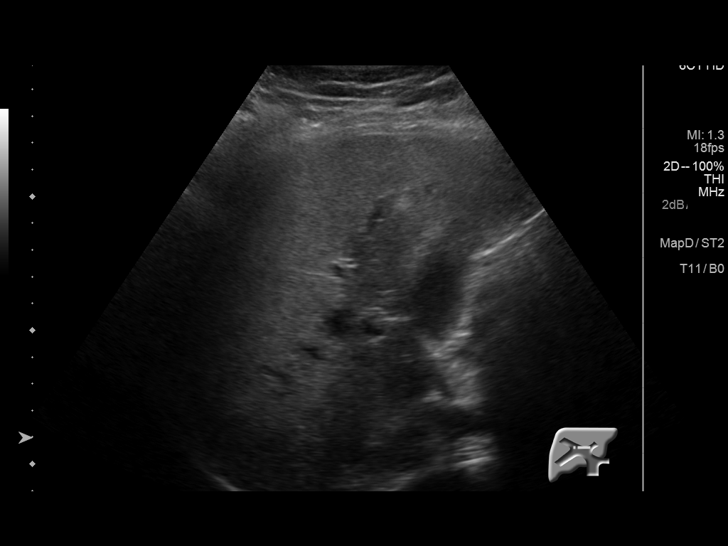
[im 59/117]
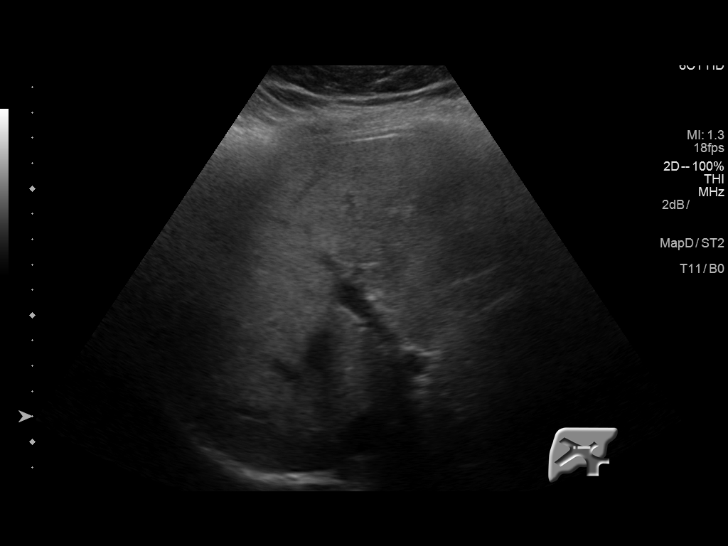
[im 68/117]
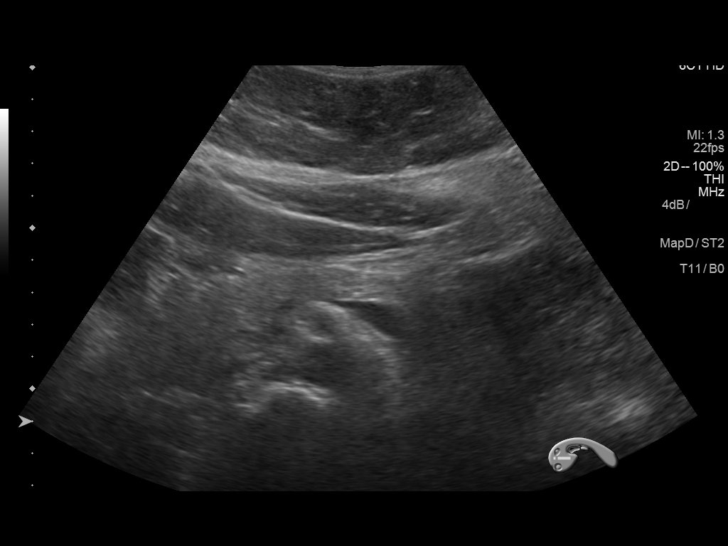
[im 78/117]
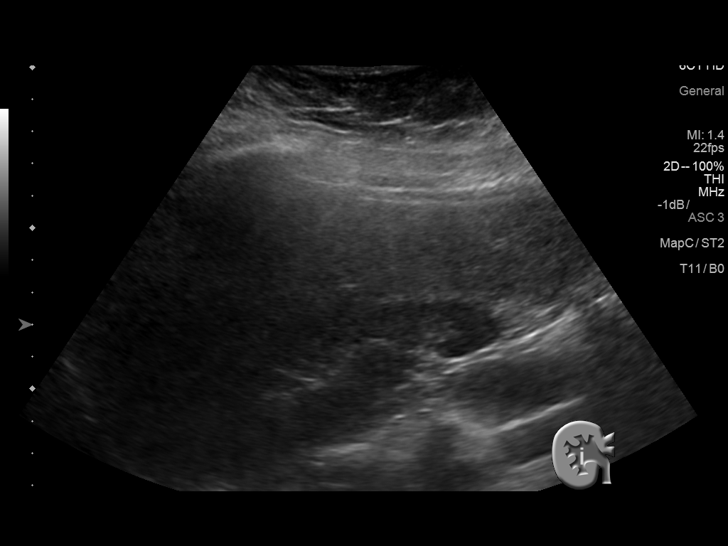
[im 88/117]
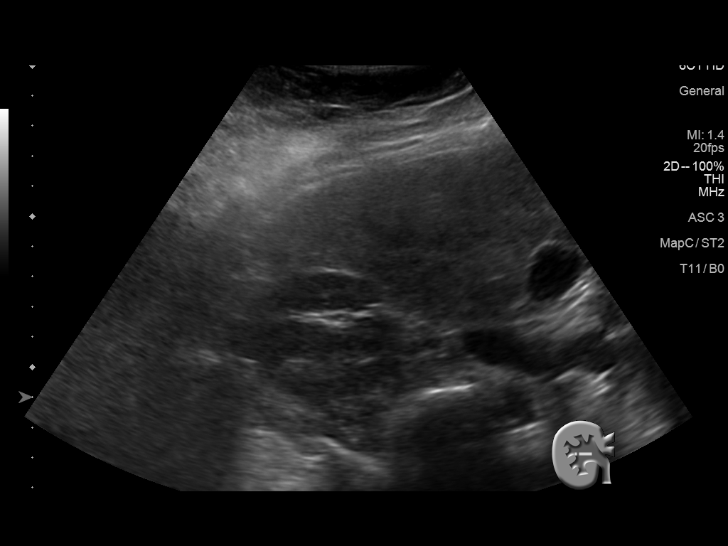
[im 97/117]
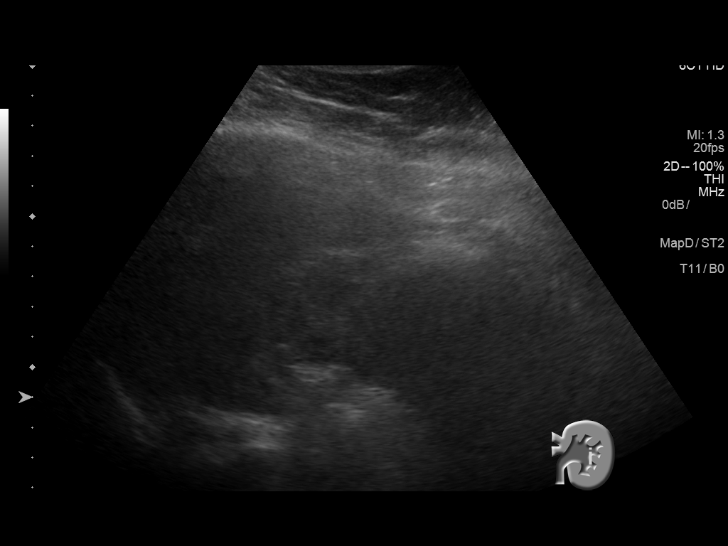
[im 107/117]
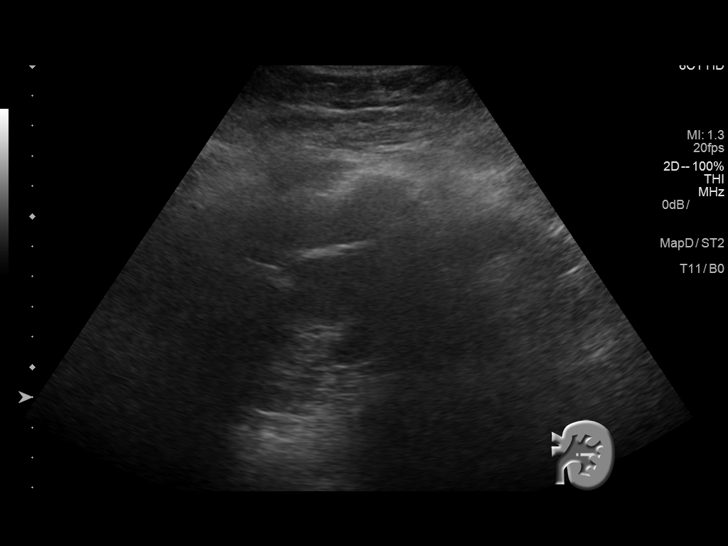
[im 117/117]
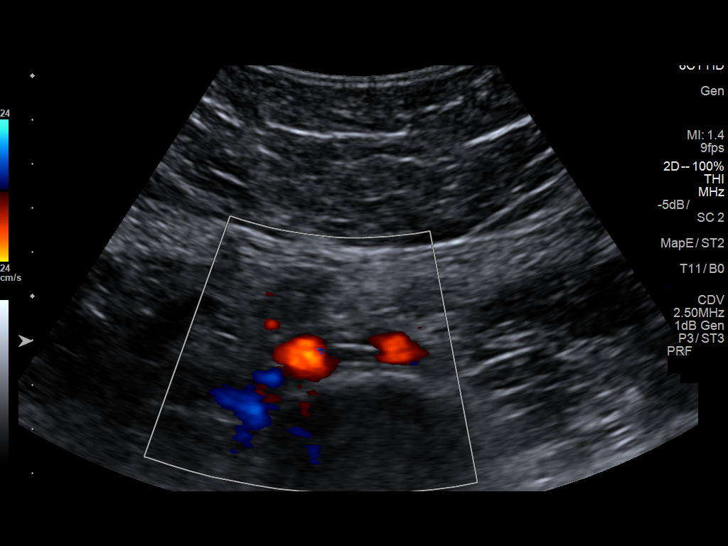

[13 of 25 positions shown; findings below may reference images not displayed]

FINDINGS: Gallbladder: The gallbladder is visualized and no gallstones are
noted. There is no pain over the gallbladder with compression.

Common bile duct: Diameter: The common bile duct is normal measuring
1.2 mm in diameter.

Liver: Only 1 of the previously demonstrated 2 liver hemangiomas is
visualized. This is near the porta hepatis and measures 1.4 x 1.1 x
0.9 cm. This apparent hemangioma previously measured 1.4 x 1.8 x
cm. No additional hepatic lesion is seen. The liver parenchyma is
inhomogeneous and echogenic consistent with hepatic steatosis.
Portal vein is patent on color Doppler imaging with normal direction
of blood flow towards the liver.

IVC: No abnormality visualized.

Pancreas: The pancreas is relatively well visualized by ultrasound
with only the distal tail obscured by bowel gas. Pancreatic duct is
not dilated.

Spleen: The spleen measures 8.7 cm.

Right Kidney: Length: 11.6 cm. The echogenicity of the renal
parenchyma appears normal.. No hydronephrosis is seen.

Left Kidney: Length: 11.4 cm.. No hydronephrosis is seen. The
echogenicity of the left renal parenchymal also appears normal.

Abdominal aorta: No abnormality of the abdominal aorta is seen.

Other findings: None.
IMPRESSION: 1. Echogenic inhomogeneous liver parenchyma consistent with hepatic
steatosis.
2. Only 1 of the previously 2 echogenic foci within the liver is
visualized and appears unchanged. No other hepatic abnormality is
seen.
3. No gallstones.  No ductal dilatation.

## 2019-01-31 ENCOUNTER — Ambulatory Visit
Admission: EM | Admit: 2019-01-31 | Discharge: 2019-01-31 | Disposition: A | Discharge status: Home / Self Care | Payer: 59 | Attending: Emergency Medicine | Admitting: Emergency Medicine

## 2019-01-31 ENCOUNTER — Other Ambulatory Visit: Payer: Self-pay

## 2019-01-31 DIAGNOSIS — Z20822 Contact with and (suspected) exposure to covid-19: Secondary | ICD-10-CM

## 2019-01-31 LAB — POC SARS CORONAVIRUS 2 AG -  ED: SARS Coronavirus 2 Ag: NEGATIVE

## 2019-01-31 MED ORDER — FLUTICASONE PROPIONATE 50 MCG/ACT NA SUSP: 1.0000 | Freq: Every day | NASAL | 0 refills | Status: DC

## 2019-01-31 MED ORDER — BENZONATATE 100 MG PO CAPS: 100.0000 mg | ORAL_CAPSULE | Freq: Three times a day (TID) | ORAL | 0 refills | Status: DC

## 2019-01-31 MED ORDER — ONDANSETRON HCL 4 MG PO TABS: 4.0000 mg | ORAL_TABLET | Freq: Three times a day (TID) | ORAL | 0 refills | Status: DC | PRN

## 2019-01-31 NOTE — ED Triage Notes (Signed)
Pt presents to UC w/ c/o nasal congestion, some fatigue, nausea, cough starting last night.

## 2019-01-31 NOTE — ED Provider Notes (Signed)
[facility]    CSN: [id number] Arrival date & time: 01/31/19  0825      History   Chief Complaint Chief Complaint  Patient presents with  . Nasal Congestion  . Cough    HPI Gloria Alvarado is a 34 y.o. female.   Patient presents to the urgent care with a complaint of nasal congestion, cough, fatigue and nausea since last night.  Denies sick exposure to COVID, flu or strep.  Denies recent travel.  Denies aggravating or alleviating symptoms.  Denies previous COVID infection.   Denies fever, chills, nasal congestion, rhinorrhea, sore throat,  SOB, wheezing, chest pain,, vomiting, changes in bowel or bladder habits.       Past Medical History:  Diagnosis Date  . Abdominal pain 04/17/2015  . Back pain 06/05/2015  . Contraceptive management 05/03/2013  . Depression 04/17/2015  . Dizzy spells 04/17/2015  . Gas 04/17/2015  . Hemangioma   . Nausea 04/17/2015  . Shortness of breath 04/17/2015    Patient Active Problem List   Diagnosis Date Noted  . Body mass index 29.0-29.9, adult 08/10/2018  . Body mass index 30.0-30.9, adult 07/13/2018  . Encounter for weight loss counseling 06/15/2018  . Body mass index 31.0-31.9, adult 06/15/2018  . Fatty liver 10/06/2017  . Elevated liver enzymes 10/06/2017  . Elevated serum creatinine 10/06/2017  . [place] 06/24/2016  . Back pain 06/05/2015  . Hemangioma of liver 04/20/2015  . Dizzy spells 04/17/2015  . Abdominal pain 04/17/2015  . Shortness of breath 04/17/2015  . Depression with anxiety 04/17/2015    Past Surgical History:  Procedure Laterality Date  . COLONOSCOPY N/A 07/27/2015   Procedure: COLONOSCOPY;  Surgeon: Rogene Houston, MD;  [facility];  Service: Endoscopy;  Laterality: N/A;  225 - moved to 1:30 - Ann notified pt  . WISDOM TOOTH EXTRACTION      OB History    Gravida  1   Para  1   Term  1   Preterm  0   AB  0   Living  1     SAB  0   TAB  0   Ectopic  0   Multiple  0   Live Births    1            Home Medications    Prior to Admission medications   Medication Sig Start Date End Date Taking? Authorizing Provider  benzonatate (TESSALON) 100 MG capsule Take 1 capsule (100 mg total) by mouth every 8 (eight) hours. 01/31/19   Emerson Monte, FNP  buPROPion (WELLBUTRIN SR) 150 MG 12 hr tablet Take 1 tablet by mouth once daily 07/20/18   Derrek Monaco A, NP  fluticasone (FLONASE) 50 MCG/ACT nasal spray Place 1 spray into both nostrils daily for 14 days. 01/31/19 02/14/19  Annabell Oconnor, Darrelyn Hillock, FNP  loratadine (CLARITIN) 10 MG tablet Take 10 mg by mouth daily.    [provider]  norethindrone-ethinyl estradiol (LOESTRIN) 1-20 MG-MCG tablet Take 1 tablet by mouth daily. 06/04/18   Estill Dooms, NP  ondansetron (ZOFRAN) 4 MG tablet Take 1 tablet (4 mg total) by mouth every 8 (eight) hours as needed for nausea or vomiting. 01/31/19   Azhar Knope, Darrelyn Hillock, FNP  phentermine (ADIPEX-P) 37.5 MG tablet Take 1 tablet (37.5 mg total) by mouth daily before breakfast. 08/10/18   Estill Dooms, NP  Prenatal Vit-Fe Fumarate-FA (PRENATAL VITAMIN PO) Take by mouth daily.    [provider]  Probiotic Product (PROBIOTIC PO) Take by mouth daily.    [provider]    Family History Family History  Problem Relation Age of Onset  . Hypertension Mother   . Hyperlipidemia Mother   . Cancer Maternal Grandfather   . Hypertension Sister   . Thyroid disease Maternal Grandmother   . Prostate cancer Father   . Hyperlipidemia Father   . Ovarian cysts Maternal Aunt     Social History Social History   Tobacco Use  . Smoking status: Former Smoker    Packs/day: 0.25    Types: Cigarettes    Start date: 04/25/2000    Quit date: 04/25/2005    Years since quitting: 13.7  . Smokeless tobacco: Never Used  Substance Use Topics  . Alcohol use: No    Alcohol/week: 0.0 standard drinks  . Drug use: No     Allergies   Sulfa antibiotics   Review of  Systems Review of Systems  Constitutional: Positive for fatigue.  HENT: Positive for congestion.   Respiratory: Positive for cough.   Cardiovascular: Negative.   Gastrointestinal: Positive for nausea.  Neurological: Negative.   All other systems reviewed and are negative.    Physical Exam Triage Vital Signs ED Triage Vitals  Enc Vitals Group     BP 01/31/19 0835 126/77     Pulse Rate 01/31/19 0835 75     Resp 01/31/19 0835 16     Temp 01/31/19 0835 98.2 F (36.8 C)     Temp Source 01/31/19 0835 Oral     SpO2 01/31/19 0835 97 %     Weight --      Height --      Head Circumference --      Peak Flow --      Pain Score 01/31/19 0843 0     Pain Loc --      Pain Edu? --      Excl. in [place]? --    No data found.  Updated Vital Signs BP 126/77 (BP Location: Right Arm)   Pulse 75   Temp 98.2 F (36.8 C) (Oral)   Resp 16   SpO2 97%   Visual Acuity Right Eye Distance:   Left Eye Distance:   Bilateral Distance:    Right Eye Near:   Left Eye Near:    Bilateral Near:     Physical Exam Vitals and nursing note reviewed.  Constitutional:      General: She is not in acute distress.    Appearance: Normal appearance. She is normal weight. She is not ill-appearing or toxic-appearing.  HENT:     Head: Normocephalic.     Right Ear: Tympanic membrane, ear canal and external ear normal. There is no impacted cerumen.     Left Ear: Tympanic membrane, ear canal and external ear normal. There is no impacted cerumen.     Nose: Nose normal. No congestion.     Mouth/Throat:     Mouth: Mucous membranes are moist.     Pharynx: Oropharynx is clear. No oropharyngeal exudate or posterior oropharyngeal erythema.  Cardiovascular:     Rate and Rhythm: Normal rate and regular rhythm.     Pulses: Normal pulses.     Heart sounds: Normal heart sounds. No murmur.  Pulmonary:     Effort: Pulmonary effort is normal. No respiratory distress.     Breath sounds: Normal breath sounds. No wheezing or  rhonchi.  Chest:     Chest wall: No tenderness.  Abdominal:     General: Abdomen is flat. Bowel sounds are normal. There is no distension.     Palpations: There is no mass.     Tenderness: There is no abdominal tenderness.  Skin:    Capillary Refill: Capillary refill takes less than 2 seconds.  Neurological:     General: No focal deficit present.     Mental Status: She is alert and oriented to person, place, and time.      UC Treatments / Results  Labs (all labs ordered are listed, but only abnormal results are displayed) Labs Reviewed  POC SARS CORONAVIRUS 2 AG -  ED    EKG   Radiology No results found.  Procedures Procedures (including critical care time)  Medications Ordered in UC Medications - No data to display  Initial Impression / Assessment and Plan / UC Course  I have reviewed the triage vital signs and the nursing notes.  Pertinent labs & imaging results that were available during my care of the patient were reviewed by me and considered in my medical decision making (see chart for details).    Point-of-care COVID-19 test was ordered and results reviewed Advised patient to quarantine Zofran was prescribed for nausea Tessalon for cough  Flonase for congestion To go to ED for worsening of symptoms Patient verbalized an understanding of the plan of care  Final Clinical Impressions(s) / UC Diagnoses   Final diagnoses:  Suspected COVID-19 virus infection     Discharge Instructions     Point-of-care COVID-19 test was negative  COVID testing ordered.  It will take between 2-7 days for test results.  Someone will contact you regarding abnormal results.    In the meantime: You should remain isolated in your home for 10 days from symptom onset AND greater than 72 hours after symptoms resolution (absence of fever without the use of fever-reducing medication and improvement in respiratory symptoms), whichever is longer Get plenty of rest and push  fluids Tessalon Perles prescribed for cough Zofran for nausea Flonase prescribed for nasal congestion and runny nose Use medications daily for symptom relief Use OTC medications like ibuprofen or tylenol as needed fever or pain Call or go to the ED if you have any new or worsening symptoms such as fever, worsening cough, shortness of breath, chest tightness, chest pain, turning blue, changes in mental status, etc...     ED Prescriptions    Medication Sig Dispense Auth. Provider   fluticasone (FLONASE) 50 MCG/ACT nasal spray Place 1 spray into both nostrils daily for 14 days. 16 g Jamaya Sleeth S, FNP   benzonatate (TESSALON) 100 MG capsule Take 1 capsule (100 mg total) by mouth every 8 (eight) hours. 21 capsule Ressie Slevin S, FNP   ondansetron (ZOFRAN) 4 MG tablet Take 1 tablet (4 mg total) by mouth every 8 (eight) hours as needed for nausea or vomiting. 20 tablet Linley Moskal, Darrelyn Hillock, FNP     PDMP not reviewed this encounter.   Emerson Monte, [place] 01/31/19 [phone]

## 2019-01-31 NOTE — Discharge Instructions (Addendum)
Point-of-care COVID-19 test was negative  COVID testing ordered.  It will take between 2-7 days for test results.  Someone will contact you regarding abnormal results.    In the meantime: You should remain isolated in your home for 10 days from symptom onset AND greater than 72 hours after symptoms resolution (absence of fever without the use of fever-reducing medication and improvement in respiratory symptoms), whichever is longer Get plenty of rest and push fluids Tessalon Perles prescribed for cough Zofran for nausea Flonase prescribed for nasal congestion and runny nose Use medications daily for symptom relief Use OTC medications like ibuprofen or tylenol as needed fever or pain Call or go to the ED if you have any new or worsening symptoms such as fever, worsening cough, shortness of breath, chest tightness, chest pain, turning blue, changes in mental status, etc..Marland Kitchen

## 2019-02-01 LAB — NOVEL CORONAVIRUS, NAA: SARS-CoV-2, NAA: NOT DETECTED

## 2019-02-15 ENCOUNTER — Encounter: Payer: Self-pay | Admitting: Family Medicine

## 2019-03-29 ENCOUNTER — Encounter: Payer: Self-pay | Admitting: Adult Health

## 2019-03-29 ENCOUNTER — Telehealth (INDEPENDENT_AMBULATORY_CARE_PROVIDER_SITE_OTHER): Payer: 59 | Admitting: Adult Health

## 2019-03-29 ENCOUNTER — Other Ambulatory Visit: Payer: Self-pay

## 2019-03-29 VITALS — BP 119/80 | Ht 65.0 in | Wt 186.0 lb

## 2019-03-29 DIAGNOSIS — Z713 Dietary counseling and surveillance: Secondary | ICD-10-CM

## 2019-03-29 DIAGNOSIS — Z6830 Body mass index (BMI) 30.0-30.9, adult: Secondary | ICD-10-CM | POA: Diagnosis not present

## 2019-03-29 MED ORDER — PHENTERMINE HCL 37.5 MG PO TABS: 37.5000 mg | ORAL_TABLET | Freq: Every day | ORAL | 0 refills | Status: DC

## 2019-03-29 NOTE — Progress Notes (Signed)
Patient ID: [name], female   DOB: 1985-02-21, 34 y.o.   MRN: [id number]   TELEHEALTH VIRTUAL GYNECOLOGY VISIT ENCOUNTER NOTE  I connected with Gloria Alvarado on 03/29/19 at  9:50 AM EDT by telephone at home and verified that I am speaking with the correct person using two identifiers.   I discussed the limitations, risks, security and privacy concerns of performing an evaluation and management service by telephone and the availability of in person appointments. I also discussed with the patient that there may be a patient responsible charge related to this service. The patient expressed understanding and agreed to proceed.   History:  Gloria Alvarado is a 34 y.o. G42P1001 female being evaluated today for weight gain, has gained about 15 lbs, she had used adipex in the past and did well, last time taking was in August.She is wondering about to have another child or not,discussed talking with partner, she still has time. She denies headaches, dizziness, or other concerns.       Past Medical History:  Diagnosis Date  . Abdominal pain 04/17/2015  . Back pain 06/05/2015  . Contraceptive management 05/03/2013  . Depression 04/17/2015  . Dizzy spells 04/17/2015  . Gas 04/17/2015  . Hemangioma   . Nausea 04/17/2015  . Shortness of breath 04/17/2015   Past Surgical History:  Procedure Laterality Date  . COLONOSCOPY N/A 07/27/2015   Procedure: COLONOSCOPY;  Surgeon: Rogene Houston, MD;  [facility];  Service: Endoscopy;  Laterality: N/A;  225 - moved to 1:30 - Ann notified pt  . WISDOM TOOTH EXTRACTION     The following portions of the patient's history were reviewed and updated as appropriate: allergies, current medications, past family history, past medical history, past social history, past surgical history and problem list.   Health Maintenance:  Normal pap and negative HRHPV on 10/14/2018.  Review of Systems:  Pertinent items noted in HPI and remainder of comprehensive ROS  otherwise negative.  Physical Exam:   General:  Alert, oriented and cooperative.   Mental Status: Normal mood and affect perceived. Normal judgment and thought content.  Physical exam deferred due to nature of the encounter BP 119/80   Ht 5\' 5"  (1.651 m)   Wt 186 lb (84.4 kg)   LMP 02/26/2019 (Exact Date)   BMI 30.95 kg/m   Labs and Imaging No results found for this or any previous visit (from the past 336 hour(s)). No results found.    Assessment and Plan:     1. Body mass index 30.0-30.9, adult Will start intermittent fasting  Will Rx adipex Meds ordered this encounter  Medications  . phentermine (ADIPEX-P) 37.5 MG tablet    Sig: Take 1 tablet (37.5 mg total) by mouth daily before breakfast.    Dispense:  30 tablet    Refill:  0    Order Specific Question:   Supervising Provider    Answer:   Elonda Husky, LUTHER H [[id number]]    2. Encounter for weight loss counseling Follow up in 4 weeks        I discussed the assessment and treatment plan with the patient. The patient was provided an opportunity to ask questions and all were answered. The patient agreed with the plan and demonstrated an understanding of the instructions.   The patient was advised to call back or seek an in-person evaluation/go to the ED if the symptoms worsen or if the condition fails to improve as anticipated.  I provided 8  minutes of non-face-to-face time during this encounter.   Derrek Monaco, NP [facility], [place]

## 2019-04-26 ENCOUNTER — Encounter: Payer: Self-pay | Admitting: Adult Health

## 2019-04-26 ENCOUNTER — Telehealth (INDEPENDENT_AMBULATORY_CARE_PROVIDER_SITE_OTHER): Payer: 59 | Admitting: Adult Health

## 2019-04-26 ENCOUNTER — Other Ambulatory Visit: Payer: Self-pay

## 2019-04-26 VITALS — BP 127/78 | Ht 65.0 in | Wt 179.0 lb

## 2019-04-26 DIAGNOSIS — Z713 Dietary counseling and surveillance: Secondary | ICD-10-CM

## 2019-04-26 DIAGNOSIS — Z6829 Body mass index (BMI) 29.0-29.9, adult: Secondary | ICD-10-CM

## 2019-04-26 MED ORDER — PHENTERMINE HCL 37.5 MG PO TABS: 37.5000 mg | ORAL_TABLET | Freq: Every day | ORAL | 0 refills | Status: DC

## 2019-04-26 NOTE — Progress Notes (Signed)
Patient ID: Gloria Alvarado, female   DOB: 03-18-1985, 34 y.o.   MRN: [id number]   TELEHEALTH VIRTUAL GYNECOLOGY VISIT ENCOUNTER NOTE  I connected with Karle Plumber on 04/26/19 at  9:50 AM EDT by telephone at home and verified that I am speaking with the correct person using two identifiers.   I discussed the limitations, risks, security and privacy concerns of performing an evaluation and management service by telephone and the availability of in person appointments. I also discussed with the patient that there may be a patient responsible charge related to this service. The patient expressed understanding and agreed to proceed.   History:  Gloria Alvarado is a 34 y.o. G39P1001 female being evaluated today for wight and BP check on Adipex, she has list 7 lbs this month. She denies any unusual headaches, dizziness, trouble sleeping or other concerns.       Past Medical History:  Diagnosis Date  . Abdominal pain 04/17/2015  . Back pain 06/05/2015  . Contraceptive management 05/03/2013  . Depression 04/17/2015  . Dizzy spells 04/17/2015  . Gas 04/17/2015  . Hemangioma   . Nausea 04/17/2015  . Shortness of breath 04/17/2015   Past Surgical History:  Procedure Laterality Date  . COLONOSCOPY N/A 07/27/2015   Procedure: COLONOSCOPY;  Surgeon: Rogene Houston, MD;  [facility];  Service: Endoscopy;  Laterality: N/A;  225 - moved to 1:30 - Ann notified pt  . WISDOM TOOTH EXTRACTION     The following portions of the patient's history were reviewed and updated as appropriate: allergies, current medications, past family history, past medical history, past social history, past surgical history and problem list.   Health Maintenance:  Normal pap and negative HRHPV on 10/13/17  Mammogram NA  Review of Systems:  Pertinent items noted in HPI and remainder of comprehensive ROS otherwise negative.  Physical Exam:   General:  Alert, oriented and cooperative.   Mental Status: Normal mood and affect  perceived. Normal judgment and thought content.  Physical exam deferred due to nature of the encounter BP 127/78 (BP Location: Left Arm, Patient Position: Sitting, Cuff Size: Normal)   Ht 5\' 5"  (1.651 m)   Wt 179 lb (81.2 kg)   BMI 29.79 kg/m   Labs and Imaging No results found for this or any previous visit (from the past 336 hour(s)). No results found.    Assessment and Plan:     1. Encounter for weight loss counseling Continue adipex, and intermittent fasting and exercising Meds ordered this encounter  Medications  . phentermine (ADIPEX-P) 37.5 MG tablet    Sig: Take 1 tablet (37.5 mg total) by mouth daily before breakfast.    Dispense:  30 tablet    Refill:  0    Order Specific Question:   Supervising Provider    Answer:   Tania Ade H [[id number]]   Follow up in 4 weeks   2. Body mass index 29.0-29.9, adult        I discussed the assessment and treatment plan with the patient. The patient was provided an opportunity to ask questions and all were answered. The patient agreed with the plan and demonstrated an understanding of the instructions.   The patient was advised to call back or seek an in-person evaluation/go to the ED if the symptoms worsen or if the condition fails to improve as anticipated.  I provided  5 minutes of non-face-to-face time during this encounter.   Derrek Monaco, NP [facility]

## 2019-05-03 ENCOUNTER — Other Ambulatory Visit: Payer: Self-pay | Admitting: Adult Health

## 2019-05-24 ENCOUNTER — Telehealth (INDEPENDENT_AMBULATORY_CARE_PROVIDER_SITE_OTHER): Payer: 59 | Admitting: Adult Health

## 2019-05-24 ENCOUNTER — Encounter: Payer: Self-pay | Admitting: Adult Health

## 2019-05-24 ENCOUNTER — Other Ambulatory Visit: Payer: Self-pay

## 2019-05-24 VITALS — BP 118/82 | Ht 65.0 in | Wt 176.0 lb

## 2019-05-24 DIAGNOSIS — Z713 Dietary counseling and surveillance: Secondary | ICD-10-CM

## 2019-05-24 DIAGNOSIS — Z6829 Body mass index (BMI) 29.0-29.9, adult: Secondary | ICD-10-CM

## 2019-05-24 MED ORDER — PHENTERMINE HCL 37.5 MG PO TABS: 37.5000 mg | ORAL_TABLET | Freq: Every day | ORAL | 0 refills | Status: DC

## 2019-05-24 NOTE — Progress Notes (Signed)
Patient ID: Gloria Alvarado, female   DOB: 24-Apr-1985, 34 y.o.   MRN: [id number]   TELEHEALTH VIRTUAL GYNECOLOGY VISIT ENCOUNTER NOTE  I connected with Gloria Alvarado on 05/24/19 at  8:30 AM EDT by telephone at home and verified that I am speaking with the correct person using two identifiers.   I discussed the limitations, risks, security and privacy concerns of performing an evaluation and management service by telephone and the availability of in person appointments. I also discussed with the patient that there may be a patient responsible charge related to this service. The patient expressed understanding and agreed to proceed.   History:  Gloria Alvarado is a 34 y.o. G66P1001 female being evaluated today for weight and BP check.  She denies any headaches, trouble sleeping or other concerns.       Past Medical History:  Diagnosis Date  . Abdominal pain 04/17/2015  . Back pain 06/05/2015  . Contraceptive management 05/03/2013  . Depression 04/17/2015  . Dizzy spells 04/17/2015  . Gas 04/17/2015  . Hemangioma   . Nausea 04/17/2015  . Shortness of breath 04/17/2015   Past Surgical History:  Procedure Laterality Date  . COLONOSCOPY N/A 07/27/2015   Procedure: COLONOSCOPY;  Surgeon: Rogene Houston, MD;  [facility];  Service: Endoscopy;  Laterality: N/A;  225 - moved to 1:30 - Ann notified pt  . WISDOM TOOTH EXTRACTION     The following portions of the patient's history were reviewed and updated as appropriate: allergies, current medications, past family history, past medical history, past social history, past surgical history and problem list.   Health Maintenance:  Normal pap and negative HRHPV on 09/16/17.  Review of Systems:  Pertinent items noted in HPI and remainder of comprehensive ROS otherwise negative.  Physical Exam:   General:  Alert, oriented and cooperative.   Mental Status: Normal mood and affect perceived. Normal judgment and thought content.  Physical exam deferred  due to nature of the encounter BP 118/82 (BP Location: Left Arm, Patient Position: Sitting, Cuff Size: Normal)   Ht 5\' 5"  (1.651 m)   Wt 176 lb (79.8 kg)   LMP 04/30/2019 (Approximate)   Breastfeeding No   BMI 29.29 kg/m lost 3 more lbs for total of 10.  Labs and Imaging No results found for this or any previous visit (from the past 336 hour(s)). No results found.    Assessment and Plan:     1. Encounter for weight loss counseling -continue weight loss efforts Will refill adipex Meds ordered this encounter  Medications  . phentermine (ADIPEX-P) 37.5 MG tablet    Sig: Take 1 tablet (37.5 mg total) by mouth daily before breakfast.    Dispense:  30 tablet    Refill:  0    Order Specific Question:   Supervising Provider    Answer:   Elonda Husky, LUTHER H [[id number]]    2. Body mass index 29.0-29.9, adult        I discussed the assessment and treatment plan with the patient. The patient was provided an opportunity to ask questions and all were answered. The patient agreed with the plan and demonstrated an understanding of the instructions.   The patient was advised to call back or seek an in-person evaluation/go to the ED if the symptoms worsen or if the condition fails to improve as anticipated.  I provided  5 minutes of non-face-to-face time during this encounter.   Derrek Monaco, NP [facility]

## 2019-06-21 ENCOUNTER — Telehealth (INDEPENDENT_AMBULATORY_CARE_PROVIDER_SITE_OTHER): Payer: 59 | Admitting: Adult Health

## 2019-06-21 ENCOUNTER — Encounter: Payer: Self-pay | Admitting: Adult Health

## 2019-06-21 VITALS — BP 126/79 | HR 88 | Ht 65.0 in | Wt 171.0 lb

## 2019-06-21 DIAGNOSIS — Z713 Dietary counseling and surveillance: Secondary | ICD-10-CM | POA: Diagnosis not present

## 2019-06-21 DIAGNOSIS — Z6828 Body mass index (BMI) 28.0-28.9, adult: Secondary | ICD-10-CM

## 2019-06-21 MED ORDER — PHENTERMINE HCL 37.5 MG PO TABS: 37.5000 mg | ORAL_TABLET | Freq: Every day | ORAL | 0 refills | Status: DC

## 2019-06-21 NOTE — Progress Notes (Signed)
Patient ID: Gloria Alvarado, female   DOB: [birth year], 34 y.o.   MRN: [id number]   TELEHEALTH GYNECOLOGY VISIT ENCOUNTER NOTE  I connected with Gloria Alvarado on 06/21/19 at  8:50 AM EDT by telephone at home and verified that I am speaking with the correct person using two identifiers.   I discussed the limitations, risks, security and privacy concerns of performing an evaluation and management service by telephone and the availability of in person appointments. I also discussed with the patient that there may be a patient responsible charge related to this service. The patient expressed understanding and agreed to proceed.   History:  Gloria Alvarado is a 34 y.o. G43P1001 female being evaluated today for weight check, has lost 5 lbs mor for total of 15 lbs, would like to lose about 5-10 more lbs. She denies any headaches or sleep disturbances, or other concerns.       Past Medical History:  Diagnosis Date  . Abdominal pain 04/17/2015  . Back pain 06/05/2015  . Contraceptive management 05/03/2013  . Depression 04/17/2015  . Dizzy spells 04/17/2015  . Gas 04/17/2015  . Hemangioma   . Nausea 04/17/2015  . Shortness of breath 04/17/2015   Past Surgical History:  Procedure Laterality Date  . COLONOSCOPY N/A 07/27/2015   Procedure: COLONOSCOPY;  Surgeon: Rogene Houston, MD;  [facility];  Service: Endoscopy;  Laterality: N/A;  225 - moved to 1:30 - Ann notified pt  . WISDOM TOOTH EXTRACTION     The following portions of the patient's history were reviewed and updated as appropriate: allergies, current medications, past family history, past medical history, past social history, past surgical history and problem list.   Health Maintenance:  Normal pap and negative HRHPV on 10/13/17  Review of Systems:  Pertinent items noted in HPI and remainder of comprehensive ROS otherwise negative.  Physical Exam:   General:  Alert, oriented and cooperative.   Mental Status: Normal mood and affect  perceived. Normal judgment and thought content.  Physical exam deferred due to nature of the encounter BP 126/79 (BP Location: Left Arm)   Pulse 88   Ht 5\' 5"  (1.651 m)   Wt 171 lb (77.6 kg)   LMP 05/28/2019   Breastfeeding No   BMI 28.46 kg/m  Labs and Imaging No results found for this or any previous visit (from the past 336 hour(s)). No results found.    Assessment and Plan:     1. Encounter for weight loss counseling Continue weight loss efforts and will rx adipex another month and then will need to stop for a while to see how she does without it.   2. Body mass index 28.0-28.9, adult       I discussed the assessment and treatment plan with the patient. The patient was provided an opportunity to ask questions and all were answered. The patient agreed with the plan and demonstrated an understanding of the instructions.   The patient was advised to call back or seek an in-person evaluation/go to the ED if the symptoms worsen or if the condition fails to improve as anticipated.  I provided 5 minutes of non-face-to-face time during this encounter.   Derrek Monaco, NP [facility], [place]

## 2019-07-11 ENCOUNTER — Other Ambulatory Visit: Payer: Self-pay | Admitting: Adult Health

## 2019-07-20 ENCOUNTER — Telehealth: Payer: 59 | Admitting: Adult Health

## 2019-09-14 ENCOUNTER — Other Ambulatory Visit: Payer: Self-pay | Admitting: *Deleted

## 2019-09-14 MED ORDER — NORETHINDRONE ACET-ETHINYL EST 1-20 MG-MCG PO TABS: 1.0000 | ORAL_TABLET | Freq: Every day | ORAL | 6 refills | Status: DC

## 2019-10-17 ENCOUNTER — Other Ambulatory Visit: Payer: Self-pay | Admitting: Adult Health

## 2020-01-17 ENCOUNTER — Other Ambulatory Visit: Payer: Self-pay | Admitting: Adult Health

## 2020-03-31 ENCOUNTER — Other Ambulatory Visit: Payer: Self-pay | Admitting: Adult Health

## 2020-05-08 ENCOUNTER — Encounter: Payer: Self-pay | Admitting: Adult Health

## 2020-05-08 ENCOUNTER — Other Ambulatory Visit (HOSPITAL_COMMUNITY)
Admission: RE | Admit: 2020-05-08 | Discharge: 2020-05-08 | Disposition: A | Discharge status: Home / Self Care | Payer: 59 | Source: Ambulatory Visit | Attending: Adult Health | Admitting: Adult Health

## 2020-05-08 ENCOUNTER — Other Ambulatory Visit: Payer: Self-pay

## 2020-05-08 ENCOUNTER — Ambulatory Visit (INDEPENDENT_AMBULATORY_CARE_PROVIDER_SITE_OTHER): Payer: 59 | Admitting: Adult Health

## 2020-05-08 VITALS — BP 106/62 | HR 80 | Ht 65.0 in | Wt 186.0 lb

## 2020-05-08 DIAGNOSIS — Z3041 Encounter for surveillance of contraceptive pills: Secondary | ICD-10-CM | POA: Diagnosis not present

## 2020-05-08 DIAGNOSIS — F418 Other specified anxiety disorders: Secondary | ICD-10-CM | POA: Diagnosis not present

## 2020-05-08 DIAGNOSIS — Z01419 Encounter for gynecological examination (general) (routine) without abnormal findings: Secondary | ICD-10-CM | POA: Insufficient documentation

## 2020-05-08 MED ORDER — BUPROPION HCL ER (SR) 150 MG PO TB12
150.0000 mg | ORAL_TABLET | Freq: Every day | ORAL | 3 refills | Status: DC
Start: 2020-05-08 — End: 2021-07-02

## 2020-05-08 MED ORDER — NORETHINDRONE ACET-ETHINYL EST 1-20 MG-MCG PO TABS
1.0000 | ORAL_TABLET | Freq: Every day | ORAL | 4 refills | Status: DC
Start: 2020-05-08 — End: 2021-07-02

## 2020-05-08 NOTE — Progress Notes (Signed)
Patient ID: Gloria Alvarado, female   DOB: Jan 31, 1985, 35 y.o.   MRN: [id number] History of Present Illness:  Gloria Alvarado is a 35 year old white female, married, G1P1 in for a well woman gyn exam and pap. PCP is Dr Wolfgang Phoenix.  Current Medications, Allergies, Past Medical History, Past Surgical History, Family History and Social History were reviewed in [employer] record.     Review of Systems: Patient denies any headaches, hearing loss, fatigue, blurred vision, shortness of breath, chest pain, abdominal pain, problems with bowel movements, urination, or intercourse. No joint pain or mood swings. She is doing [employer] has wedding to be in end of October.  She is happy with OCs and says husband by get vasectomy      Physical Exam:BP 106/62 (BP Location: Left Arm, Patient Position: Sitting, Cuff Size: Normal)   Pulse 80   Ht 5\' 5"  (1.651 m)   Wt 186 lb (84.4 kg)   LMP 04/28/2020   BMI 30.95 kg/m  General:  Well developed, well nourished, no acute distress Skin:  Warm and dry Neck:  Midline trachea, normal thyroid, good ROM, no lymphadenopathy Lungs; Clear to auscultation bilaterally Breast:  No dominant palpable mass, retraction, or nipple discharge Cardiovascular: Regular rate and rhythm Abdomen:  Soft, non tender, no hepatosplenomegaly Pelvic:  External genitalia is normal in appearance, no lesions.  The vagina is normal in appearance. Urethra has no lesions or masses. The cervix is bulbous. Pap with HR HPV genotyping performed. Uterus is felt to be normal size, shape, and contour.  No adnexal masses or tenderness noted.Bladder is non tender, no masses felt. Extremities/musculoskeletal:  No swelling or varicosities noted, no clubbing or cyanosis Psych:  No mood changes, alert and cooperative,seems happy AA is 2 Fall risk is low PHQ 9 score is 0, is on Wellbutrin  GAD 7 score is 0  [facility] - 05/08/20 1330      Pregnancy Intention Screening   Does the patient want to  become pregnant in the next year? No    Does the patient's partner want to become pregnant in the next year? No    Would the patient like to discuss contraceptive options today? No      Contraception Wrap Up   Current Method Oral Contraceptive    End Method Oral Contraceptive    Contraception Counseling Provided No         Examination chaperoned by Estill Bamberg LPN  Impression and Plan: 1. Encounter for gynecological examination with Papanicolaou smear of cervix Pap sent Physical in 1 year Pap in 3 if normal Check CBC,CMP,TSH and lipids,to get fasting   2. Depression with anxiety Will refill Wellbutrin  Meds ordered this encounter  Medications  . buPROPion (WELLBUTRIN SR) 150 MG 12 hr tablet    Sig: Take 1 tablet (150 mg total) by mouth daily.    Dispense:  90 tablet    Refill:  3    Order Specific Question:   Supervising Provider    Answer:   Elonda Husky, LUTHER H [[id number]]  . norethindrone-ethinyl estradiol (MICROGESTIN) 1-20 MG-MCG tablet    Sig: Take 1 tablet by mouth daily.    Dispense:  63 tablet    Refill:  4    Order Specific Question:   Supervising Provider    Answer:   Elonda Husky, LUTHER H [[id number]]    3. Encounter for surveillance of contraceptive pills Will refill Microgestin 1-20

## 2020-05-10 LAB — CYTOLOGY - PAP
Comment: NEGATIVE
Diagnosis: NEGATIVE
High risk HPV: NEGATIVE

## 2020-05-31 LAB — LIPID PANEL
Chol/HDL Ratio: 3.3 ratio (ref 0.0–4.4)
Cholesterol, Total: 179 mg/dL (ref 100–199)
HDL: 54 mg/dL (ref >39)
LDL Chol Calc (NIH): 112 mg/dL — ABNORMAL HIGH (ref 0–99)
Triglycerides: 66 mg/dL (ref 0–149)
VLDL Cholesterol Cal: 13 mg/dL (ref 5–40)

## 2020-05-31 LAB — COMPREHENSIVE METABOLIC PANEL
ALT: 24 IU/L (ref 0–32)
AST: 14 IU/L (ref 0–40)
Albumin/Globulin Ratio: 1.7 (ref 1.2–2.2)
Albumin: 4.5 g/dL (ref 3.8–4.8)
Alkaline Phosphatase: 85 IU/L (ref 44–121)
BUN/Creatinine Ratio: 13 (ref 9–23)
BUN: 11 mg/dL (ref 6–20)
Bilirubin Total: 0.8 mg/dL (ref 0.0–1.2)
CO2: 24 mmol/L (ref 20–29)
Calcium: 9.3 mg/dL (ref 8.7–10.2)
Chloride: 100 mmol/L (ref 96–106)
Creatinine, Ser: 0.86 mg/dL (ref 0.57–1.00)
Globulin, Total: 2.6 g/dL (ref 1.5–4.5)
Glucose: 79 mg/dL (ref 65–99)
Potassium: 4.3 mmol/L (ref 3.5–5.2)
Sodium: 139 mmol/L (ref 134–144)
Total Protein: 7.1 g/dL (ref 6.0–8.5)
eGFR: 91 mL/min/{1.73_m2} (ref >59)

## 2020-05-31 LAB — CBC
Hematocrit: 43.3 % (ref 34.0–46.6)
Hemoglobin: 14.5 g/dL (ref 11.1–15.9)
MCH: 30.3 pg (ref 26.6–33.0)
MCHC: 33.5 g/dL (ref 31.5–35.7)
MCV: 90 fL (ref 79–97)
Platelets: 236 10*3/uL (ref 150–450)
RBC: 4.79 x10E6/uL (ref 3.77–5.28)
RDW: 12.1 % (ref 11.7–15.4)
WBC: 5 10*3/uL (ref 3.4–10.8)

## 2020-05-31 LAB — TSH: TSH: 1.64 u[IU]/mL (ref 0.450–4.500)

## 2020-07-12 ENCOUNTER — Other Ambulatory Visit: Payer: Self-pay | Admitting: Adult Health

## 2020-07-12 ENCOUNTER — Other Ambulatory Visit: Payer: Self-pay

## 2020-07-12 MED ORDER — PHENTERMINE HCL 37.5 MG PO TABS: 37.5000 mg | ORAL_TABLET | Freq: Every day | ORAL | 0 refills | Status: DC

## 2020-07-12 NOTE — Progress Notes (Signed)
Refilled phentermine

## 2020-08-21 ENCOUNTER — Encounter: Payer: Self-pay | Admitting: Adult Health

## 2020-08-21 ENCOUNTER — Telehealth (INDEPENDENT_AMBULATORY_CARE_PROVIDER_SITE_OTHER): Payer: 59 | Admitting: Adult Health

## 2020-08-21 VITALS — BP 117/80 | HR 78 | Ht 65.0 in | Wt 175.0 lb

## 2020-08-21 DIAGNOSIS — Z713 Dietary counseling and surveillance: Secondary | ICD-10-CM

## 2020-08-21 DIAGNOSIS — Z0130 Encounter for examination of blood pressure without abnormal findings: Secondary | ICD-10-CM | POA: Diagnosis not present

## 2020-08-21 DIAGNOSIS — Z6829 Body mass index (BMI) 29.0-29.9, adult: Secondary | ICD-10-CM | POA: Diagnosis not present

## 2020-08-21 MED ORDER — PHENTERMINE HCL 37.5 MG PO TABS: 37.5000 mg | ORAL_TABLET | Freq: Every day | ORAL | 0 refills | Status: DC

## 2020-08-21 NOTE — Progress Notes (Signed)
Patient ID: Gloria Alvarado, female   DOB: 1985/06/09, 35 y.o.   MRN: [id number]   TELEHEALTH GYNECOLOGY VISIT ENCOUNTER NOTE  Provider location: [facility] for [place] [facility]   Patient location: Home  I connected with Gloria Alvarado on 08/21/20 at  3:50 PM EDT by telephone and verified that I am speaking with the correct person using two identifiers. Patient was unable to do MyChart audiovisual encounter due to technical difficulties, she tried several times.    I discussed the limitations, risks, security and privacy concerns of performing an evaluation and management service by telephone and the availability of in person appointments. I also discussed with the patient that there may be a patient responsible charge related to this service. The patient expressed understanding and agreed to proceed.   History:  Gloria Alvarado is a 35 y.o. G62P1001 female being evaluated today for weight and BP check, is taking adipex and doing [employer], has lost 6 lbs since adipex, but about 16 total. She denies any headaches or trouble sleeping or other concerns.       Past Medical History:  Diagnosis Date   Abdominal pain 04/17/2015   Back pain 06/05/2015   Contraceptive management 05/03/2013   Depression 04/17/2015   Dizzy spells 04/17/2015   Gas 04/17/2015   Hemangioma    Nausea 04/17/2015   Shortness of breath 04/17/2015   Past Surgical History:  Procedure Laterality Date   COLONOSCOPY N/A 07/27/2015   Procedure: COLONOSCOPY;  Surgeon: Rogene Houston, MD;  [facility];  Service: Endoscopy;  Laterality: N/A;  225 - moved to 1:30 - Ann notified pt   WISDOM TOOTH EXTRACTION     The following portions of the patient's history were reviewed and updated as appropriate: allergies, current medications, past family history, past medical history, past social history, past surgical history and problem list.   Health Maintenance: Lab Results  Component Value Date   DIAGPAP  05/08/2020    -  Negative for intraepithelial lesion or malignancy (NILM)   HPV NOT DETECTED 10/13/2017   [place] Negative 05/08/2020     Review of Systems:  Pertinent items noted in HPI and remainder of comprehensive ROS otherwise negative.  Physical Exam:   General:  Alert, oriented and cooperative.   Mental Status: Normal mood and affect perceived. Normal judgment and thought content.  Physical exam deferred due to nature of the encounter BP 117/80 (BP Location: Left Arm, Patient Position: Sitting, Cuff Size: Normal)   Pulse 78   Ht '5\' 5"'$  (1.651 m)   Wt 175 lb (79.4 kg)   LMP 08/18/2020   BMI 29.12 kg/m   Fall risk is low  [facility] - 08/21/20 1605       Pregnancy Intention Screening   Does the patient want to become pregnant in the next year? No    Does the patient's partner want to become pregnant in the next year? No    Would the patient like to discuss contraceptive options today? No      Contraception Wrap Up   Current Method Oral Contraceptive    End Method Oral Contraceptive    Contraception Counseling Provided No             Labs and Imaging No results found for this or any previous visit (from the past 336 hour(s)). No results found.    Assessment and Plan:          1. Encounter for weight loss counseling Continue  efforts with [employer] Will refill adipex Meds ordered this encounter  Medications   phentermine (ADIPEX-P) 37.5 MG tablet    Sig: Take 1 tablet (37.5 mg total) by mouth daily before breakfast.    Dispense:  30 tablet    Refill:  0    Order Specific Question:   Supervising Provider    Answer:   Tania Ade H [[id number]]    Follow up in 4 weeks, can be tele visit  2. Body mass index 29.0-29.9, adult       I discussed the assessment and treatment plan with the patient. The patient was provided an opportunity to ask questions and all were answered. The patient agreed with the plan and demonstrated an understanding of the instructions.   The patient was advised to  call back or seek an in-person evaluation/go to the ED if the symptoms worsen or if the condition fails to improve as anticipated.  I provided 5  minutes of non-face-to-face time during this encounter. I was in my office at [facility] tree for this encounter   Derrek Monaco, NP [facility] for [facility], [place]

## 2020-08-23 ENCOUNTER — Other Ambulatory Visit: Payer: Self-pay | Admitting: Adult Health

## 2020-08-23 MED ORDER — PHENTERMINE HCL 37.5 MG PO TABS: 37.5000 mg | ORAL_TABLET | Freq: Every day | ORAL | 0 refills | Status: DC

## 2020-08-23 NOTE — Progress Notes (Signed)
Rx sent to [facility] in [place]

## 2020-09-25 ENCOUNTER — Encounter: Payer: Self-pay | Admitting: Adult Health

## 2020-09-25 ENCOUNTER — Telehealth (INDEPENDENT_AMBULATORY_CARE_PROVIDER_SITE_OTHER): Payer: 59 | Admitting: Adult Health

## 2020-09-25 VITALS — BP 119/76 | HR 95 | Ht 65.0 in | Wt 170.0 lb

## 2020-09-25 DIAGNOSIS — Z6828 Body mass index (BMI) 28.0-28.9, adult: Secondary | ICD-10-CM | POA: Diagnosis not present

## 2020-09-25 DIAGNOSIS — Z713 Dietary counseling and surveillance: Secondary | ICD-10-CM

## 2020-09-25 MED ORDER — PHENTERMINE HCL 37.5 MG PO TABS: 37.5000 mg | ORAL_TABLET | Freq: Every day | ORAL | 0 refills | Status: DC

## 2020-09-25 NOTE — Progress Notes (Signed)
Patient ID: KRESHA CAMPION, female   DOB: Feb 11, 1985, 35 y.o.   MRN: [id number]   TELEHEALTH GYNECOLOGY VISIT ENCOUNTER NOTE  Provider location: [facility] for [place] [facility]   Patient location: Home  I connected with Karle Plumber on 09/25/20 at  2:10 PM EDT by telephone and verified that I am speaking with the correct person using two identifiers. Patient was unable to do MyChart audiovisual encounter due to technical difficulties, she tried several times.    I discussed the limitations, risks, security and privacy concerns of performing an evaluation and management service by telephone and the availability of in person appointments. I also discussed with the patient that there may be a patient responsible charge related to this service. The patient expressed understanding and agreed to proceed.   History:  MOYA ZHU is a 35 y.o. G32P1001 female being evaluated today for weight and BP and has lost 5 lbs. She denies any headaches or trouble sleeping, or other concerns.       Past Medical History:  Diagnosis Date   Abdominal pain 04/17/2015   Back pain 06/05/2015   Contraceptive management 05/03/2013   Depression 04/17/2015   Dizzy spells 04/17/2015   Gas 04/17/2015   Hemangioma    Nausea 04/17/2015   Shortness of breath 04/17/2015   Past Surgical History:  Procedure Laterality Date   COLONOSCOPY N/A 07/27/2015   Procedure: COLONOSCOPY;  Surgeon: Rogene Houston, MD;  [facility];  Service: Endoscopy;  Laterality: N/A;  225 - moved to 1:30 - Ann notified pt   WISDOM TOOTH EXTRACTION     The following portions of the patient's history were reviewed and updated as appropriate: allergies, current medications, past family history, past medical history, past social history, past surgical history and problem list.   Health Maintenance:   Lab Results  Component Value Date   DIAGPAP  05/08/2020    - Negative for intraepithelial lesion or malignancy (NILM)   HPV NOT  DETECTED 10/13/2017   [place] Negative 05/08/2020     Review of Systems:  Pertinent items noted in HPI and remainder of comprehensive ROS otherwise negative.  Physical Exam:   General:  Alert, oriented and cooperative.   Mental Status: Normal mood and affect perceived. Normal judgment and thought content.  Physical exam deferred due to nature of the encounter BP 119/76 (BP Location: Left Arm)   Pulse 95   Ht '5\' 5"'$  (1.651 m)   Wt 170 lb (77.1 kg)   LMP 09/14/2020 (Approximate)   BMI 28.29 kg/m    [facility] - 09/25/20 1447       Pregnancy Intention Screening   Does the patient want to become pregnant in the next year? No    Does the patient's partner want to become pregnant in the next year? No    Would the patient like to discuss contraceptive options today? No      Contraception Wrap Up   Current Method Oral Contraceptive    End Method Oral Contraceptive    Contraception Counseling Provided No             Labs and Imaging No results found for this or any previous visit (from the past 336 hour(s)). No results found.    Assessment and Plan:    1. Encounter for weight loss counseling Will refill adipex Meds ordered this encounter  Medications   phentermine (ADIPEX-P) 37.5 MG tablet    Sig: Take 1 tablet (37.5 mg total)  by mouth daily before breakfast.    Dispense:  30 tablet    Refill:  0    Order Specific Question:   Supervising Provider    Answer:   Elonda Husky, LUTHER H [[id number]]   Continue weight loss efforts   2. Body mass index 28.0-28.9, adult       I discussed the assessment and treatment plan with the patient. The patient was provided an opportunity to ask questions and all were answered. The patient agreed with the plan and demonstrated an understanding of the instructions.   The patient was advised to call back or seek an in-person evaluation/go to the ED if the symptoms worsen or if the condition fails to improve as anticipated.  I provided 8 minutes of  non-face-to-face time during this encounter. I was in my office at [facility] tree during this visit.  Derrek Monaco, NP Center for [facility], [place]

## 2020-10-23 ENCOUNTER — Encounter: Payer: Self-pay | Admitting: Adult Health

## 2020-10-23 ENCOUNTER — Telehealth: Payer: Self-pay | Admitting: Adult Health

## 2020-10-23 VITALS — BP 117/79 | HR 73 | Ht 65.0 in | Wt 165.8 lb

## 2020-10-23 DIAGNOSIS — Z6827 Body mass index (BMI) 27.0-27.9, adult: Secondary | ICD-10-CM

## 2020-10-23 DIAGNOSIS — Z713 Dietary counseling and surveillance: Secondary | ICD-10-CM

## 2020-10-23 MED ORDER — PHENTERMINE HCL 37.5 MG PO TABS: 37.5000 mg | ORAL_TABLET | Freq: Every day | ORAL | 0 refills | Status: DC

## 2020-10-23 NOTE — Progress Notes (Signed)
Patient ID: Gloria Alvarado, female   DOB: April 23, 1985, 35 y.o.   MRN: [id number]   TELEHEALTH GYNECOLOGY VISIT ENCOUNTER NOTE  Provider location: Center for [place] [facility]   Patient location: Home  I connected with Karle Plumber on 10/23/20 at  4:10 PM EDT by telephone and verified that I am speaking with the correct person using two identifiers. Patient was unable to do MyChart audiovisual encounter due to technical difficulties, she tried several times.    I discussed the limitations, risks, security and privacy concerns of performing an evaluation and management service by telephone and the availability of in person appointments. I also discussed with the patient that there may be a patient responsible charge related to this service. The patient expressed understanding and agreed to proceed.   History:  Gloria Alvarado is a 35 y.o. G24P1001 female being evaluated today for weight and BP. She has lost about 10 lbs total, wants to lose about 3 more.She denies any headaches or trouble sleeping,or other concerns.       Past Medical History:  Diagnosis Date   Abdominal pain 04/17/2015   Back pain 06/05/2015   Contraceptive management 05/03/2013   Depression 04/17/2015   Dizzy spells 04/17/2015   Gas 04/17/2015   Hemangioma    Nausea 04/17/2015   Shortness of breath 04/17/2015   Past Surgical History:  Procedure Laterality Date   COLONOSCOPY N/A 07/27/2015   Procedure: COLONOSCOPY;  Surgeon: Rogene Houston, MD;  [facility];  Service: Endoscopy;  Laterality: N/A;  225 - moved to 1:30 - Ann notified pt   WISDOM TOOTH EXTRACTION     The following portions of the patient's history were reviewed and updated as appropriate: allergies, current medications, past family history, past medical history, past social history, past surgical history and problem list.   Health Maintenance:  Lab Results  Component Value Date   DIAGPAP  05/08/2020    - Negative for intraepithelial  lesion or malignancy (NILM)   HPV NOT DETECTED 10/13/2017   [place] Negative 05/08/2020     Review of Systems:  Pertinent items noted in HPI and remainder of comprehensive ROS otherwise negative.  Physical Exam:   General:  Alert, oriented and cooperative.   Mental Status: Normal mood and affect perceived. Normal judgment and thought content.  Physical exam deferred due to nature of the encounter BP 117/79 (BP Location: Left Arm, Patient Position: Sitting, Cuff Size: Normal)   Pulse 73   Ht 5\' 5"  (1.651 m)   Wt 165 lb 12.8 oz (75.2 kg)   LMP 10/10/2020 (Exact Date)   BMI 27.59 kg/m   Fall risk is low  Upstream - 10/23/20 1636       Pregnancy Intention Screening   Does the patient want to become pregnant in the next year? No    Does the patient's partner want to become pregnant in the next year? No    Would the patient like to discuss contraceptive options today? No      Contraception Wrap Up   Current Method Oral Contraceptive    End Method Oral Contraceptive    Contraception Counseling Provided No             Labs and Imaging No results found for this or any previous visit (from the past 336 hour(s)). No results found.    Assessment and Plan:      1. Encounter for weight loss counseling Will refill adipex 1 more time  Meds ordered this encounter  Medications   phentermine (ADIPEX-P) 37.5 MG tablet    Sig: Take 1 tablet (37.5 mg total) by mouth daily before breakfast.    Dispense:  30 tablet    Refill:  0    Order Specific Question:   Supervising Provider    Answer:   Tania Ade H [[id number]]    Follow up prn  2. Body mass index 27.0-27.9, adult       I discussed the assessment and treatment plan with the patient. The patient was provided an opportunity to ask questions and all were answered. The patient agreed with the plan and demonstrated an understanding of the instructions.   The patient was advised to call back or seek an in-person evaluation/go to the  ED if the symptoms worsen or if the condition fails to improve as anticipated.  I provided 5 minutes of non-face-to-face time during this encounter. I was in my office at [facility] during this encounter   Derrek Monaco, [place] for [employer], [place]

## 2020-12-01 ENCOUNTER — Other Ambulatory Visit: Payer: Self-pay

## 2020-12-01 ENCOUNTER — Ambulatory Visit
Admission: EM | Admit: 2020-12-01 | Discharge: 2020-12-01 | Disposition: A | Discharge status: Home / Self Care | Payer: Self-pay | Attending: Family Medicine | Admitting: Family Medicine

## 2020-12-01 DIAGNOSIS — R1033 Periumbilical pain: Secondary | ICD-10-CM

## 2020-12-01 DIAGNOSIS — R110 Nausea: Secondary | ICD-10-CM

## 2020-12-01 LAB — POCT URINE PREGNANCY: Preg Test, Ur: NEGATIVE

## 2020-12-01 LAB — POCT URINALYSIS DIP (MANUAL ENTRY)
Bilirubin, UA: NEGATIVE
Glucose, UA: NEGATIVE mg/dL
Ketones, POC UA: NEGATIVE mg/dL
Leukocytes, UA: NEGATIVE
Nitrite, UA: NEGATIVE
Protein Ur, POC: NEGATIVE mg/dL
Spec Grav, UA: 1.02 (ref 1.010–1.025)
Urobilinogen, UA: 0.2 E.U./dL
pH, UA: 6.5 (ref 5.0–8.0)

## 2020-12-01 MED ORDER — AMOXICILLIN-POT CLAVULANATE 875-125 MG PO TABS: 1.0000 | ORAL_TABLET | Freq: Two times a day (BID) | ORAL | 0 refills | Status: DC

## 2020-12-01 NOTE — ED Provider Notes (Signed)
[facility]    CSN: [id number] Arrival date & time: 12/01/20  0809      History   Chief Complaint Chief Complaint  Patient presents with   Nausea   Abdominal Pain    HPI Gloria Alvarado is a 35 y.o. female.   Patient presenting today with mild intermittent nausea the past week or so and now 3 days of umbilical region redness, bloating, pain and notes some bleeding from the bellybutton yesterday.  She denies fever, chills, vomiting, constipation, diarrhea, urinary symptoms, new medications or diet changes.  No past history of hernias.  Has not been trying anything over-the-counter for symptoms.    Past Medical History:  Diagnosis Date   Abdominal pain 04/17/2015   Back pain 06/05/2015   Contraceptive management 05/03/2013   Depression 04/17/2015   Dizzy spells 04/17/2015   Gas 04/17/2015   Hemangioma    Nausea 04/17/2015   Shortness of breath 04/17/2015    Patient Active Problem List   Diagnosis Date Noted   Body mass index 27.0-27.9, adult 10/23/2020   Encounter for surveillance of contraceptive pills 05/08/2020   Encounter for gynecological examination with Papanicolaou smear of cervix 05/08/2020   Body mass index 28.0-28.9, adult 06/21/2019   Body mass index 29.0-29.9, adult 08/10/2018   Body mass index 30.0-30.9, adult 07/13/2018   Encounter for weight loss counseling 06/15/2018   Body mass index 31.0-31.9, adult 06/15/2018   Fatty liver 10/06/2017   Elevated liver enzymes 10/06/2017   Elevated serum creatinine 10/06/2017   [place] 06/24/2016   Back pain 06/05/2015   Hemangioma of liver 04/20/2015   Dizzy spells 04/17/2015   Abdominal pain 04/17/2015   Shortness of breath 04/17/2015   Depression with anxiety 04/17/2015    Past Surgical History:  Procedure Laterality Date   COLONOSCOPY N/A 07/27/2015   Procedure: COLONOSCOPY;  Surgeon: Rogene Houston, MD;  [facility];  Service: Endoscopy;  Laterality: N/A;  225 - moved to 1:30 - Ann notified pt    WISDOM TOOTH EXTRACTION      OB History     Gravida  1   Para  1   Term  1   Preterm  0   AB  0   Living  1      SAB  0   IAB  0   Ectopic  0   Multiple  0   Live Births  1            Home Medications    Prior to Admission medications   Medication Sig Start Date End Date Taking? Authorizing Provider  amoxicillin-clavulanate (AUGMENTIN) 875-125 MG tablet Take 1 tablet by mouth every 12 (twelve) hours. 12/01/20  Yes Volney American, PA-C  buPROPion [facility] SR) 150 MG 12 hr tablet Take 1 tablet (150 mg total) by mouth daily. 05/08/20   Estill Dooms, NP  Cholecalciferol (VITAMIN D3) 1.25 MG (50000 UT) CAPS Take by mouth.    [provider]  loratadine (CLARITIN) 10 MG tablet Take 10 mg by mouth daily.    [provider]  norethindrone-ethinyl estradiol (MICROGESTIN) 1-20 MG-MCG tablet Take 1 tablet by mouth daily. 05/08/20   Estill Dooms, NP  phentermine (ADIPEX-P) 37.5 MG tablet Take 1 tablet (37.5 mg total) by mouth daily before breakfast. Patient not taking: Reported on 12/01/2020 10/23/20   Estill Dooms, NP  PREBIOTIC PRODUCT PO Take by mouth.    [provider]  Probiotic Product (PROBIOTIC PO)  Take by mouth daily.    [provider]  Zinc 10 MG LOZG Use as directed in the mouth or throat.    [provider]    Family History Family History  Problem Relation Age of Onset   Hypertension Mother    Hyperlipidemia Mother    Cancer Maternal Grandfather    Hypertension Sister    Thyroid disease Maternal Grandmother    Prostate cancer Father    Hyperlipidemia Father    Ovarian cysts Maternal Aunt     Social History Social History   Tobacco Use   Smoking status: Former    Packs/day: 0.25    Types: Cigarettes    Start date: 04/25/2000    Quit date: 04/25/2005    Years since quitting: 15.6   Smokeless tobacco: Never  Vaping Use   Vaping Use: Never used  Substance Use Topics    Alcohol use: No    Alcohol/week: 0.0 standard drinks   Drug use: No     Allergies   Sulfa antibiotics   Review of Systems Review of Systems Per HPI  Physical Exam Triage Vital Signs ED Triage Vitals [12/01/20 0827]  Enc Vitals Group     BP 137/90     Pulse Rate 87     Resp 12     Temp 98.3 F (36.8 C)     Temp Source Oral     SpO2 99 %     Weight      Height      Head Circumference      Peak Flow      Pain Score 2     Pain Loc      Pain Edu?      Excl. in [place]?    No data found.  Updated Vital Signs BP 137/90 (BP Location: Right Arm)   Pulse 87   Temp 98.3 F (36.8 C) (Oral)   Resp 12   LMP 11/17/2020 (Approximate)   SpO2 99%   Visual Acuity Right Eye Distance:   Left Eye Distance:   Bilateral Distance:    Right Eye Near:   Left Eye Near:    Bilateral Near:     Physical Exam Vitals and nursing note reviewed.  Constitutional:      Appearance: Normal appearance. She is not ill-appearing.  HENT:     Head: Atraumatic.     Mouth/Throat:     Mouth: Mucous membranes are moist.  Eyes:     Extraocular Movements: Extraocular movements intact.     Conjunctiva/sclera: Conjunctivae normal.  Cardiovascular:     Rate and Rhythm: Normal rate and regular rhythm.     Heart sounds: Normal heart sounds.  Pulmonary:     Effort: Pulmonary effort is normal.     Breath sounds: Normal breath sounds.  Abdominal:     General: Bowel sounds are normal. There is no distension.     Palpations: Abdomen is soft. There is no mass.     Tenderness: There is abdominal tenderness. There is no right CVA tenderness, left CVA tenderness, guarding or rebound.     Comments: Minimal umbilical tenderness to palpation, particularly superior to umbilicus  Musculoskeletal:        General: Normal range of motion.     Cervical back: Normal range of motion and neck supple.  Skin:    General: Skin is warm and dry.     Findings: Erythema present.     Comments: Circular region of  erythema surrounding umbilical region,  becomes darker and more macerated toward the inner portion of the umbilicus.  Dried drainage present in umbilicus  Neurological:     Mental Status: She is alert and oriented to person, place, and time.  Psychiatric:        Mood and Affect: Mood normal.        Thought Content: Thought content normal.        Judgment: Judgment normal.     UC Treatments / Results  Labs (all labs ordered are listed, but only abnormal results are displayed) Labs Reviewed  POCT URINALYSIS DIP (MANUAL ENTRY) - Abnormal; Notable for the following components:      Result Value   Blood, UA moderate (*)    All other components within normal limits  POCT URINE PREGNANCY    EKG   Radiology No results found.  Procedures Procedures (including critical care time)  Medications Ordered in UC Medications - No data to display  Initial Impression / Assessment and Plan / UC Course  I have reviewed the triage vital signs and the nursing notes.  Pertinent labs & imaging results that were available during my care of the patient were reviewed by me and considered in my medical decision making (see chart for details).     Vital signs benign and reassuring, urinalysis with hemoglobin present but she states she has been spotting this morning that is likely the cause.  Her urine pregnancy test is negative.  Unclear if superficial skin infection of the umbilical region versus new hernia though no hernia defect noted on exam.  Will trial Augmentin, warm compresses, skin care per recommendations with Hibiclens, mupirocin.  ED or primary care for imaging if symptoms worsening.  Final Clinical Impressions(s) / UC Diagnoses   Final diagnoses:  Umbilical pain  Nausea without vomiting   Discharge Instructions   None    ED Prescriptions     Medication Sig Dispense Auth. Provider   amoxicillin-clavulanate (AUGMENTIN) 875-125 MG tablet Take 1 tablet by mouth every 12 (twelve)  hours. 14 tablet Volney American, [place]      PDMP not reviewed this encounter.   Volney American, [place] 12/01/20 [phone]

## 2020-12-01 NOTE — ED Triage Notes (Signed)
Pt presents with nausea x 2 weeks and umbilical pain/bloating x 3 days. Pt states yesterday she noticed blood in her belly button twice

## 2020-12-06 ENCOUNTER — Encounter: Payer: Self-pay | Admitting: Obstetrics & Gynecology

## 2021-01-01 ENCOUNTER — Ambulatory Visit: Payer: Self-pay | Admitting: Obstetrics & Gynecology

## 2021-01-17 ENCOUNTER — Other Ambulatory Visit: Payer: Self-pay | Admitting: Obstetrics & Gynecology

## 2021-01-17 DIAGNOSIS — R1033 Periumbilical pain: Secondary | ICD-10-CM

## 2021-01-18 ENCOUNTER — Other Ambulatory Visit: Payer: Self-pay

## 2021-01-18 ENCOUNTER — Ambulatory Visit (INDEPENDENT_AMBULATORY_CARE_PROVIDER_SITE_OTHER): Payer: Self-pay | Admitting: Obstetrics & Gynecology

## 2021-01-18 ENCOUNTER — Encounter: Payer: Self-pay | Admitting: Obstetrics & Gynecology

## 2021-01-18 ENCOUNTER — Ambulatory Visit (INDEPENDENT_AMBULATORY_CARE_PROVIDER_SITE_OTHER): Payer: Self-pay

## 2021-01-18 VITALS — BP 106/65 | HR 77 | Ht 65.0 in | Wt 174.5 lb

## 2021-01-18 DIAGNOSIS — L0882 Omphalitis not of newborn: Secondary | ICD-10-CM

## 2021-01-18 DIAGNOSIS — R1033 Periumbilical pain: Secondary | ICD-10-CM

## 2021-01-18 NOTE — Progress Notes (Signed)
PELVIC US TA/TV:homogeneous anteverted uterus,WNL,EEC 1 mm,normal ovaries,ovaries appear mobile,no free fluid,no pain during ultrasound Chaperone Tish

## 2021-01-18 NOTE — Progress Notes (Signed)
Follow up appointment for results  Chief Complaint  Patient presents with   Follow-up    Discuss [place] results    Blood pressure 106/65, pulse 77, height 5\' 5"  (1.651 m), weight 174 lb 8 oz (79.2 kg), last menstrual period 12/29/2020.  US Transvaginal Non-OB  Result Date: 01/18/2021 Images from the original result were not included.  ..an [employer] of Ultrasound Medicine Diplomatic Services operational officer) accredited practice Center for [facility] @ [address] [place] [address] Ordering Provider: Florian Buff, MD                                                                                                GYNECOLOGIC SONOGRAM Gloria Alvarado is a 36 y.o. G1P1001 LMP 01/07/2021,She is here for a pelvic sonogram for umbilical pain with bleeding. Uterus                      6.4 x 4.2 x 3.5 cm, Total uterine volume 49 cc,homogeneous anteverted uterus,WNL Endometrium          1 mm, symmetrical, wnl Right ovary             2.3 x 1.3 x 2.1 cm, wnl Left ovary                1.6 x 1.3 x 1.9 cm, wnl No free fluid Technician Comments: PELVIC US TA/TV:homogeneous anteverted uterus,WNL,EEC 1 mm,normal ovaries,ovaries appear mobile,no free fluid,no pain during ultrasound Chaperone [employer] 01/18/2021 9:02 AM Clinical Impression and recommendations: I have reviewed the sonogram results above, combined with the patient's current clinical course, below are my impressions and any appropriate recommendations for management based on the sonographic findings. Uterus is normal size shape and contour Endometrium is thin normal, on OCP Ovaries: are normal size shape and morphology No evidence of peri umbilical fluid collection or other abnormalities Florian Buff 01/18/2021 11:08 AM     MEDS ordered this encounter: No orders of the defined types were placed in this encounter.   Orders for this encounter: No orders of the defined types were placed in this encounter.   Impression:   ICD-10-CM   1.  Omphalitis in adult, single episode  L08.82    No evidence of urachal cyst or urachal endometriosis       Plan: No further evaluation or management is required, if recurs patient will re contact the office  Follow Up: Return if symptoms worsen or fail to improve.       Face to face time:  10 minutes  Greater than 50% of the visit time was spent in counseling and coordination of care with the patient.  The summary and outline of the counseling and care coordination is summarized in the note above.   All questions were answered.  Past Medical History:  Diagnosis Date   Abdominal pain 04/17/2015   Back pain 06/05/2015   Contraceptive management 05/03/2013   Depression 04/17/2015   Dizzy spells 04/17/2015   Gas 04/17/2015   Hemangioma    Nausea 04/17/2015   Shortness of breath  04/17/2015    Past Surgical History:  Procedure Laterality Date   COLONOSCOPY N/A 07/27/2015   Procedure: COLONOSCOPY;  Surgeon: Rogene Houston, MD;  [facility];  Service: Endoscopy;  Laterality: N/A;  225 - moved to 1:30 - Ann notified pt   WISDOM TOOTH EXTRACTION      OB History     Gravida  1   Para  1   Term  1   Preterm  0   AB  0   Living  1      SAB  0   IAB  0   Ectopic  0   Multiple  0   Live Births  1           Allergies  Allergen Reactions   Sulfa Antibiotics Shortness Of Breath, Swelling and Other (See Comments)    Throat swelling, shortness of breath.    Social History   Socioeconomic History   Marital status: Married    Spouse name: Not on file   Number of children: 1   Years of education: Not on file   Highest education level: Not on file  Occupational History   Not on file  Tobacco Use   Smoking status: Former    Packs/day: 0.25    Types: Cigarettes    Start date: 04/25/2000    Quit date: 04/25/2005    Years since quitting: 15.7   Smokeless tobacco: Never  Vaping Use   Vaping Use: Never used  Substance and Sexual Activity   Alcohol  use: No    Alcohol/week: 0.0 standard drinks   Drug use: No   Sexual activity: Yes    Birth control/protection: Pill  Other Topics Concern   Not on file  Social History Narrative   Not on file   Social Determinants of Health   Financial Resource Strain: Low Risk    Difficulty of Paying Living Expenses: Not hard at all  Food Insecurity: No Food Insecurity   Worried About Charity fundraiser in the Last Year: Never true   [place] in the Last Year: Never true  Transportation Needs: No Transportation Needs   Lack of Transportation (Medical): No   Lack of Transportation (Non-Medical): No  Physical Activity: Insufficiently Active   Days of Exercise per Week: 3 days   Minutes of Exercise per Session: 30 min  Stress: No Stress Concern Present   Feeling of Stress : Not at all  Social Connections: Socially Integrated   Frequency of Communication with Friends and Family: More than three times a week   Frequency of Social Gatherings with Friends and Family: Three times a week   Attends Religious Services: More than 4 times per year   Active Member of Clubs or Organizations: Yes   Attends Archivist Meetings: 1 to 4 times per year   Marital Status: Married    Family History  Problem Relation Age of Onset   Hypertension Mother    Hyperlipidemia Mother    Cancer Maternal Grandfather    Hypertension Sister    Thyroid disease Maternal Grandmother    Prostate cancer Father    Hyperlipidemia Father    Ovarian cysts Maternal Aunt

## 2021-04-01 DIAGNOSIS — J069 Acute upper respiratory infection, unspecified: Secondary | ICD-10-CM | POA: Diagnosis not present

## 2021-04-09 DIAGNOSIS — A499 Bacterial infection, unspecified: Secondary | ICD-10-CM | POA: Diagnosis not present

## 2021-07-02 ENCOUNTER — Other Ambulatory Visit: Payer: Self-pay | Admitting: Adult Health

## 2021-11-26 ENCOUNTER — Ambulatory Visit (INDEPENDENT_AMBULATORY_CARE_PROVIDER_SITE_OTHER): Payer: BC Managed Care – PPO | Admitting: Adult Health

## 2021-11-26 ENCOUNTER — Encounter: Payer: Self-pay | Admitting: Adult Health

## 2021-11-26 VITALS — BP 115/75 | HR 78 | Ht 65.0 in | Wt 181.0 lb

## 2021-11-26 DIAGNOSIS — Z3041 Encounter for surveillance of contraceptive pills: Secondary | ICD-10-CM | POA: Diagnosis not present

## 2021-11-26 DIAGNOSIS — Z713 Dietary counseling and surveillance: Secondary | ICD-10-CM | POA: Diagnosis not present

## 2021-11-26 DIAGNOSIS — Z6830 Body mass index (BMI) 30.0-30.9, adult: Secondary | ICD-10-CM | POA: Diagnosis not present

## 2021-11-26 DIAGNOSIS — Z01419 Encounter for gynecological examination (general) (routine) without abnormal findings: Secondary | ICD-10-CM | POA: Diagnosis not present

## 2021-11-26 DIAGNOSIS — F418 Other specified anxiety disorders: Secondary | ICD-10-CM

## 2021-11-26 MED ORDER — PHENTERMINE HCL 37.5 MG PO TABS: 37.5000 mg | ORAL_TABLET | Freq: Every day | ORAL | 0 refills | Status: DC

## 2021-11-26 MED ORDER — NORETHINDRONE ACET-ETHINYL EST 1-20 MG-MCG PO TABS: 1.0000 | ORAL_TABLET | Freq: Every day | ORAL | 4 refills | Status: DC

## 2021-11-26 MED ORDER — BUPROPION HCL ER (SR) 150 MG PO TB12: 150.0000 mg | ORAL_TABLET | Freq: Every day | ORAL | 3 refills | Status: DC

## 2021-11-26 NOTE — Progress Notes (Signed)
Patient ID: Gloria Alvarado, female   DOB: 1985-07-30, 36 y.o.   MRN: [id number] History of Present Illness: Gloria Alvarado is a 36 year old white female,married, G1P1 in for a well woman gyn exa. She just got back from [place]. She is working out and doing [employer].   Last pap 05/08/20 negative HPV and malignancy  PCP is Sallee Lange MD.   Current Medications, Allergies, Past Medical History, Past Surgical History, Family History and Social History were reviewed in [employer] record.     Review of Systems: Patient denies any headaches, hearing loss, fatigue, blurred vision, shortness of breath, chest pain, abdominal pain, problems with bowel movements, urination, or intercourse. No joint pain. Has noticed moody before periods and crampy. Wants help losing weight, has wedding in December.     Physical Exam:BP 115/75 (BP Location: Right Arm, Patient Position: Sitting, Cuff Size: Normal)   Pulse 78   Ht '5\' 5"'$  (1.651 m)   Wt 181 lb (82.1 kg)   LMP 11/10/2021 (Exact Date)   BMI 30.12 kg/m   General:  Well developed, well nourished, no acute distress Skin:  Warm and dry Neck:  Midline trachea, normal thyroid, good ROM, no lymphadenopathy Lungs; Clear to auscultation bilaterally Breast:  No dominant palpable mass, retraction, or nipple discharge Cardiovascular: Regular rate and rhythm Abdomen:  Soft, non tender, no hepatosplenomegaly Pelvic:  External genitalia is normal in appearance, no lesions.  The vagina is normal in appearance. Urethra has no lesions or masses. The cervix is bulbous.  Uterus is felt to be normal size, shape, and contour.  No adnexal masses or tenderness noted.Bladder is non tender, no masses felt. Rectal: Deferred Extremities/musculoskeletal:  No swelling or varicosities noted, no clubbing or cyanosis Psych:  No mood changes, alert and cooperative,seems happy AA is 1 Fall risk is low    11/26/2021   10:34 AM 05/08/2020    1:28 PM 06/15/2018    10:31 AM  Depression screen PHQ 2/9  Decreased Interest 0 0 0  Down, Depressed, Hopeless 0  0  PHQ - 2 Score 0 0 0  Altered sleeping 0    Tired, decreased energy 0    Change in appetite 0    Feeling bad or failure about yourself  1    Trouble concentrating 1    Moving slowly or fidgety/restless 0    Suicidal thoughts 0    PHQ-9 Score 2         11/26/2021   10:35 AM 05/08/2020    1:30 PM  GAD 7 : Generalized Anxiety Score  Nervous, Anxious, on Edge 0 0  Control/stop worrying 0 0  Worry too much - different things 0 0  Trouble relaxing 1 0  Restless 0 0  Easily annoyed or irritable 1 0  Afraid - awful might happen 0 0  Total GAD 7 Score 2 0      Upstream - 11/26/21 1033       Pregnancy Intention Screening   Does the patient want to become pregnant in the next year? No    Does the patient's partner want to become pregnant in the next year? No    Would the patient like to discuss contraceptive options today? No      Contraception Wrap Up   Current Method Oral Contraceptive    End Method Oral Contraceptive    Contraception Counseling Provided No            Pt gave verbal permission for  exam without chaperone.  Impression and Plan: 1. Encounter for well woman exam with routine gynecological exam Physical in 1 year Pap 2025   2. Encounter for surveillance of contraceptive pills Happy with Loestrin 1/20, will refill  Meds ordered this encounter  Medications   buPROPion (WELLBUTRIN SR) 150 MG 12 hr tablet    Sig: Take 1 tablet (150 mg total) by mouth daily.    Dispense:  90 tablet    Refill:  3    Order Specific Question:   Supervising Provider    Answer:   Tania Ade H [[id number]]   norethindrone-ethinyl estradiol (LOESTRIN) 1-20 MG-MCG tablet    Sig: Take 1 tablet by mouth daily.    Dispense:  63 tablet    Refill:  4    Order Specific Question:   Supervising Provider    Answer:   Tania Ade H [[id number]]   phentermine (ADIPEX-P) 37.5 MG tablet    Sig: Take 1  tablet (37.5 mg total) by mouth daily before breakfast. Take 1 daily    Dispense:  30 tablet    Refill:  0    Order Specific Question:   Supervising Provider    Answer:   EURE, LUTHER H [[id number]]     3. Encounter for weight loss counseling Will rx phentermine  Continue working out   4. Body mass index 30.0-30.9, adult  5. Depression with anxiety Will refill Wellbutrin, doing well

## 2021-12-21 DIAGNOSIS — J029 Acute pharyngitis, unspecified: Secondary | ICD-10-CM | POA: Diagnosis not present

## 2021-12-24 ENCOUNTER — Ambulatory Visit (INDEPENDENT_AMBULATORY_CARE_PROVIDER_SITE_OTHER): Payer: BC Managed Care – PPO | Admitting: Adult Health

## 2021-12-24 ENCOUNTER — Encounter: Payer: Self-pay | Admitting: Adult Health

## 2021-12-24 VITALS — BP 118/76 | Ht 65.0 in | Wt 170.0 lb

## 2021-12-24 DIAGNOSIS — Z6828 Body mass index (BMI) 28.0-28.9, adult: Secondary | ICD-10-CM

## 2021-12-24 DIAGNOSIS — Z713 Dietary counseling and surveillance: Secondary | ICD-10-CM

## 2021-12-24 MED ORDER — PHENTERMINE HCL 37.5 MG PO TABS: 37.5000 mg | ORAL_TABLET | Freq: Every day | ORAL | 0 refills | Status: DC

## 2021-12-24 NOTE — Progress Notes (Signed)
Patient ID: Gloria Alvarado, female   DOB: [birth year], 36 y.o.   MRN: [id number]

## 2021-12-24 NOTE — Progress Notes (Signed)
Patient ID: Gloria Alvarado, female   DOB: 1985-09-16, 36 y.o.   MRN: [id number]   TELEHEALTH GYNECOLOGY VISIT ENCOUNTER NOTE  Provider location: [facility]   Patient location: Home  I connected with Gloria Alvarado on 12/24/21 at  4:10 PM EST by telephone and verified that I am speaking with the correct person using two identifiers. Patient was unable to do MyChart audiovisual encounter due to technical difficulties, she tried several times.    I discussed the limitations, risks, security and privacy concerns of performing an evaluation and management service by telephone and the availability of in person appointments. I also discussed with the patient that there may be a patient responsible charge related to this service. The patient expressed understanding and agreed to proceed.   History:  Gloria Alvarado is a 36 y.o. G31P1001 female being evaluated today for weight check has lost 11 lbs. Has bump in vulva area, can send picture if desired . She denies any abnormal vaginal discharge, bleeding, pelvic pain or other concerns.       Past Medical History:  Diagnosis Date   Abdominal pain 04/17/2015   Back pain 06/05/2015   Contraceptive management 05/03/2013   Depression 04/17/2015   Dizzy spells 04/17/2015   Gas 04/17/2015   Hemangioma    Nausea 04/17/2015   Shortness of breath 04/17/2015   Past Surgical History:  Procedure Laterality Date   COLONOSCOPY N/A 07/27/2015   Procedure: COLONOSCOPY;  Surgeon: Rogene Houston, MD;  Location: [facility];  Service: Endoscopy;  Laterality: N/A;  225 - moved to 1:30 - Ann notified pt   WISDOM TOOTH EXTRACTION     The following portions of the patient's history were reviewed and updated as appropriate: allergies, current medications, past family history, past medical history, past social history, past surgical history and problem list.   Health Maintenance:  Normal pap and negative HRHPV on 05/08/20  Review of Systems:   Pertinent items noted in HPI and remainder of comprehensive ROS otherwise negative.  Physical Exam:   General:  Alert, oriented and cooperative.   Mental Status: Normal mood and affect perceived. Normal judgment and thought content.  Physical exam deferred due to nature of the encounter BP 118/76   Ht '5\' 5"'$  (1.651 m)   Wt 170 lb (77.1 kg)   LMP 11/10/2021 (Exact Date)   BMI 28.29 kg/m    Upstream - 12/24/21 1613       Pregnancy Intention Screening   Does the patient want to become pregnant in the next year? No    Does the patient's partner want to become pregnant in the next year? No    Would the patient like to discuss contraceptive options today? No      Contraception Wrap Up   Current Method Oral Contraceptive    End Method Oral Contraceptive    Contraception Counseling Provided No             Labs and Imaging No results found for this or any previous visit (from the past 336 hour(s)). No results found.    Assessment and Plan:     1. Encounter for weight loss counseling Continue weight loss  Will refill phentermine Meds ordered this encounter  Medications   phentermine (ADIPEX-P) 37.5 MG tablet    Sig: Take 1 tablet (37.5 mg total) by mouth daily before breakfast. Take 1 daily    Dispense:  30 tablet    Refill:  0  Order Specific Question:   Supervising Provider    Answer:   Tania Ade H [[id number]]    2. BMI 28.29    Follow up prn    I discussed the assessment and treatment plan with the patient. The patient was provided an opportunity to ask questions and all were answered. The patient agreed with the plan and demonstrated an understanding of the instructions.   The patient was advised to call back or seek an in-person evaluation/go to the ED if the symptoms worsen or if the condition fails to improve as anticipated.  I provided 10 minutes of non-face-to-face time during this encounter. I was in may office at [facility] for this encounter.   Derrek Monaco, NP [facility], [place]

## 2021-12-25 ENCOUNTER — Telehealth: Payer: BC Managed Care – PPO | Admitting: Adult Health

## 2022-02-25 DIAGNOSIS — S233XXA Sprain of ligaments of thoracic spine, initial encounter: Secondary | ICD-10-CM | POA: Diagnosis not present

## 2022-02-25 DIAGNOSIS — S134XXA Sprain of ligaments of cervical spine, initial encounter: Secondary | ICD-10-CM | POA: Diagnosis not present

## 2022-02-25 DIAGNOSIS — S338XXA Sprain of other parts of lumbar spine and pelvis, initial encounter: Secondary | ICD-10-CM | POA: Diagnosis not present

## 2022-03-11 DIAGNOSIS — S338XXA Sprain of other parts of lumbar spine and pelvis, initial encounter: Secondary | ICD-10-CM | POA: Diagnosis not present

## 2022-03-11 DIAGNOSIS — S134XXA Sprain of ligaments of cervical spine, initial encounter: Secondary | ICD-10-CM | POA: Diagnosis not present

## 2022-03-11 DIAGNOSIS — S233XXA Sprain of ligaments of thoracic spine, initial encounter: Secondary | ICD-10-CM | POA: Diagnosis not present

## 2022-03-25 DIAGNOSIS — S338XXA Sprain of other parts of lumbar spine and pelvis, initial encounter: Secondary | ICD-10-CM | POA: Diagnosis not present

## 2022-03-25 DIAGNOSIS — S233XXA Sprain of ligaments of thoracic spine, initial encounter: Secondary | ICD-10-CM | POA: Diagnosis not present

## 2022-03-25 DIAGNOSIS — S134XXA Sprain of ligaments of cervical spine, initial encounter: Secondary | ICD-10-CM | POA: Diagnosis not present

## 2022-04-08 DIAGNOSIS — S233XXA Sprain of ligaments of thoracic spine, initial encounter: Secondary | ICD-10-CM | POA: Diagnosis not present

## 2022-04-08 DIAGNOSIS — S134XXA Sprain of ligaments of cervical spine, initial encounter: Secondary | ICD-10-CM | POA: Diagnosis not present

## 2022-04-08 DIAGNOSIS — S338XXA Sprain of other parts of lumbar spine and pelvis, initial encounter: Secondary | ICD-10-CM | POA: Diagnosis not present

## 2022-04-22 DIAGNOSIS — S134XXA Sprain of ligaments of cervical spine, initial encounter: Secondary | ICD-10-CM | POA: Diagnosis not present

## 2022-04-22 DIAGNOSIS — S233XXA Sprain of ligaments of thoracic spine, initial encounter: Secondary | ICD-10-CM | POA: Diagnosis not present

## 2022-04-22 DIAGNOSIS — S338XXA Sprain of other parts of lumbar spine and pelvis, initial encounter: Secondary | ICD-10-CM | POA: Diagnosis not present

## 2022-05-06 DIAGNOSIS — S338XXA Sprain of other parts of lumbar spine and pelvis, initial encounter: Secondary | ICD-10-CM | POA: Diagnosis not present

## 2022-05-06 DIAGNOSIS — S134XXA Sprain of ligaments of cervical spine, initial encounter: Secondary | ICD-10-CM | POA: Diagnosis not present

## 2022-05-06 DIAGNOSIS — S233XXA Sprain of ligaments of thoracic spine, initial encounter: Secondary | ICD-10-CM | POA: Diagnosis not present

## 2022-05-08 ENCOUNTER — Other Ambulatory Visit: Payer: Self-pay | Admitting: Adult Health

## 2022-05-08 MED ORDER — SCOPOLAMINE 1 MG/3DAYS TD PT72: MEDICATED_PATCH | TRANSDERMAL | 0 refills | Status: DC

## 2022-05-08 MED ORDER — PHENTERMINE HCL 37.5 MG PO TABS: 37.5000 mg | ORAL_TABLET | Freq: Every day | ORAL | 0 refills | Status: DC

## 2022-05-08 NOTE — Progress Notes (Signed)
Will rx phentermine and transderm scop patch, going on a cruise

## 2022-06-03 DIAGNOSIS — S134XXA Sprain of ligaments of cervical spine, initial encounter: Secondary | ICD-10-CM | POA: Diagnosis not present

## 2022-06-03 DIAGNOSIS — S338XXA Sprain of other parts of lumbar spine and pelvis, initial encounter: Secondary | ICD-10-CM | POA: Diagnosis not present

## 2022-06-03 DIAGNOSIS — S233XXA Sprain of ligaments of thoracic spine, initial encounter: Secondary | ICD-10-CM | POA: Diagnosis not present

## 2022-06-11 ENCOUNTER — Other Ambulatory Visit: Payer: Self-pay | Admitting: Adult Health

## 2022-06-11 ENCOUNTER — Encounter: Payer: Self-pay | Admitting: *Deleted

## 2022-06-11 MED ORDER — PHENTERMINE HCL 37.5 MG PO TABS: 37.5000 mg | ORAL_TABLET | Freq: Every day | ORAL | 0 refills | Status: DC

## 2022-06-11 NOTE — Progress Notes (Signed)
Refilled phentermine 

## 2022-06-17 ENCOUNTER — Other Ambulatory Visit: Payer: Self-pay | Admitting: Adult Health

## 2022-06-17 MED ORDER — PHENTERMINE HCL 37.5 MG PO TABS: 37.5000 mg | ORAL_TABLET | Freq: Every day | ORAL | 0 refills | Status: DC

## 2022-06-17 NOTE — Progress Notes (Signed)
Rx sent to [facility] for phentermine

## 2022-09-23 DIAGNOSIS — S233XXA Sprain of ligaments of thoracic spine, initial encounter: Secondary | ICD-10-CM | POA: Diagnosis not present

## 2022-09-23 DIAGNOSIS — S134XXA Sprain of ligaments of cervical spine, initial encounter: Secondary | ICD-10-CM | POA: Diagnosis not present

## 2022-09-23 DIAGNOSIS — S338XXA Sprain of other parts of lumbar spine and pelvis, initial encounter: Secondary | ICD-10-CM | POA: Diagnosis not present

## 2022-10-04 ENCOUNTER — Other Ambulatory Visit: Payer: Self-pay | Admitting: Adult Health

## 2022-10-04 MED ORDER — PHENTERMINE HCL 15 MG PO CAPS: 15.0000 mg | ORAL_CAPSULE | ORAL | 1 refills | Status: DC

## 2022-10-04 NOTE — Progress Notes (Signed)
Will rx phentermine 15 mg

## 2022-10-07 ENCOUNTER — Other Ambulatory Visit: Payer: Self-pay | Admitting: Adult Health

## 2022-10-07 MED ORDER — PHENTERMINE HCL 15 MG PO CAPS: 15.0000 mg | ORAL_CAPSULE | ORAL | 1 refills | Status: DC

## 2022-10-07 NOTE — Progress Notes (Signed)
[facility] [place] for phentermine

## 2022-10-21 DIAGNOSIS — S338XXA Sprain of other parts of lumbar spine and pelvis, initial encounter: Secondary | ICD-10-CM | POA: Diagnosis not present

## 2022-10-21 DIAGNOSIS — S134XXA Sprain of ligaments of cervical spine, initial encounter: Secondary | ICD-10-CM | POA: Diagnosis not present

## 2022-10-21 DIAGNOSIS — S233XXA Sprain of ligaments of thoracic spine, initial encounter: Secondary | ICD-10-CM | POA: Diagnosis not present

## 2022-11-04 ENCOUNTER — Other Ambulatory Visit: Payer: Self-pay | Admitting: Adult Health

## 2022-11-04 MED ORDER — PHENTERMINE HCL 30 MG PO CAPS: 30.0000 mg | ORAL_CAPSULE | ORAL | 0 refills | Status: DC

## 2022-11-07 ENCOUNTER — Other Ambulatory Visit: Payer: Self-pay | Admitting: Adult Health

## 2022-11-08 ENCOUNTER — Other Ambulatory Visit: Payer: Self-pay | Admitting: Adult Health

## 2022-11-08 MED ORDER — PHENTERMINE HCL 30 MG PO CAPS: 30.0000 mg | ORAL_CAPSULE | ORAL | 0 refills | Status: DC

## 2022-11-08 NOTE — Progress Notes (Signed)
[facility]

## 2022-11-18 DIAGNOSIS — S233XXA Sprain of ligaments of thoracic spine, initial encounter: Secondary | ICD-10-CM | POA: Diagnosis not present

## 2022-11-18 DIAGNOSIS — S338XXA Sprain of other parts of lumbar spine and pelvis, initial encounter: Secondary | ICD-10-CM | POA: Diagnosis not present

## 2022-11-18 DIAGNOSIS — S134XXA Sprain of ligaments of cervical spine, initial encounter: Secondary | ICD-10-CM | POA: Diagnosis not present

## 2022-12-02 ENCOUNTER — Other Ambulatory Visit: Payer: Self-pay | Admitting: Adult Health

## 2022-12-05 ENCOUNTER — Other Ambulatory Visit: Payer: Self-pay | Admitting: Adult Health

## 2022-12-05 MED ORDER — PHENTERMINE HCL 30 MG PO CAPS: 30.0000 mg | ORAL_CAPSULE | ORAL | 0 refills | Status: DC

## 2022-12-05 NOTE — Progress Notes (Signed)
Refilled phentermine 30 mg

## 2023-01-09 ENCOUNTER — Other Ambulatory Visit: Payer: Self-pay | Admitting: Adult Health

## 2023-01-13 ENCOUNTER — Other Ambulatory Visit: Payer: Self-pay | Admitting: Adult Health

## 2023-01-13 MED ORDER — PHENTERMINE HCL 30 MG PO CAPS: 30.0000 mg | ORAL_CAPSULE | ORAL | 0 refills | Status: DC

## 2023-01-14 DIAGNOSIS — S338XXA Sprain of other parts of lumbar spine and pelvis, initial encounter: Secondary | ICD-10-CM | POA: Diagnosis not present

## 2023-01-14 DIAGNOSIS — S134XXA Sprain of ligaments of cervical spine, initial encounter: Secondary | ICD-10-CM | POA: Diagnosis not present

## 2023-01-14 DIAGNOSIS — S233XXA Sprain of ligaments of thoracic spine, initial encounter: Secondary | ICD-10-CM | POA: Diagnosis not present

## 2023-02-10 ENCOUNTER — Other Ambulatory Visit: Payer: Self-pay | Admitting: Adult Health

## 2023-02-10 DIAGNOSIS — S134XXA Sprain of ligaments of cervical spine, initial encounter: Secondary | ICD-10-CM | POA: Diagnosis not present

## 2023-02-10 DIAGNOSIS — S338XXA Sprain of other parts of lumbar spine and pelvis, initial encounter: Secondary | ICD-10-CM | POA: Diagnosis not present

## 2023-02-10 DIAGNOSIS — S233XXA Sprain of ligaments of thoracic spine, initial encounter: Secondary | ICD-10-CM | POA: Diagnosis not present

## 2023-02-10 MED ORDER — PHENTERMINE HCL 30 MG PO CAPS: 30.0000 mg | ORAL_CAPSULE | ORAL | 0 refills | Status: DC

## 2023-03-17 ENCOUNTER — Other Ambulatory Visit: Payer: Self-pay | Admitting: Adult Health

## 2023-03-17 MED ORDER — PHENTERMINE HCL 30 MG PO CAPS: 30.0000 mg | ORAL_CAPSULE | ORAL | 0 refills | Status: DC

## 2023-06-30 ENCOUNTER — Ambulatory Visit (INDEPENDENT_AMBULATORY_CARE_PROVIDER_SITE_OTHER): Payer: Self-pay | Admitting: Adult Health

## 2023-06-30 ENCOUNTER — Encounter: Payer: Self-pay | Admitting: Adult Health

## 2023-06-30 ENCOUNTER — Other Ambulatory Visit (HOSPITAL_COMMUNITY)
Admission: RE | Admit: 2023-06-30 | Discharge: 2023-06-30 | Disposition: A | Discharge status: Home / Self Care | Source: Ambulatory Visit | Attending: Adult Health | Admitting: Adult Health

## 2023-06-30 VITALS — BP 128/76 | HR 84 | Ht 65.0 in | Wt 170.0 lb

## 2023-06-30 DIAGNOSIS — Z1331 Encounter for screening for depression: Secondary | ICD-10-CM | POA: Diagnosis not present

## 2023-06-30 DIAGNOSIS — F418 Other specified anxiety disorders: Secondary | ICD-10-CM | POA: Diagnosis not present

## 2023-06-30 DIAGNOSIS — Z131 Encounter for screening for diabetes mellitus: Secondary | ICD-10-CM

## 2023-06-30 DIAGNOSIS — Z01419 Encounter for gynecological examination (general) (routine) without abnormal findings: Secondary | ICD-10-CM

## 2023-06-30 DIAGNOSIS — Z13220 Encounter for screening for lipoid disorders: Secondary | ICD-10-CM | POA: Diagnosis not present

## 2023-06-30 DIAGNOSIS — Z1329 Encounter for screening for other suspected endocrine disorder: Secondary | ICD-10-CM

## 2023-06-30 MED ORDER — BUPROPION HCL ER (SR) 150 MG PO TB12: 150.0000 mg | ORAL_TABLET | Freq: Every day | ORAL | 3 refills | Status: AC

## 2023-06-30 NOTE — Progress Notes (Signed)
 Patient ID: JOURNE HALLMARK, female   DOB: 1985/10/07, 38 y.o.   MRN: [id number] History of Present Illness: Virdia is a 38 year old white female, married, G1P1001, in for a well woman gyn exam and pap.   Current Medications, Allergies, Past Medical History, Past Surgical History, Family History and Social History were reviewed in [employer] record.     Review of Systems: Patient denies any headaches, hearing loss, fatigue, blurred vision, shortness of breath, chest pain, abdominal pain, problems with bowel movements, urination, or intercourse. No joint pain or mood swings.  Periods are regular and lasts about 5 days   Physical Exam:BP 128/76 (BP Location: Right Arm, Patient Position: Sitting, Cuff Size: Normal)   Pulse 84   Ht 5' 5 (1.651 m)   Wt 170 lb (77.1 kg)   LMP 06/10/2023 (Exact Date)   BMI 28.29 kg/m   General:  Well developed, well nourished, no acute distress Skin:  Warm and dry Neck:  Midline trachea, normal thyroid, good ROM, no lymphadenopathy Lungs; Clear to auscultation bilaterally Breast:  No dominant palpable mass, retraction, or nipple discharge Cardiovascular: Regular rate and rhythm Abdomen:  Soft, non tender, no hepatosplenomegaly Pelvic:  External genitalia is normal in appearance, no lesions.  The vagina is normal in appearance. Urethra has no lesions or masses. The cervix is bulbous, everted at the os, pap with HR HPV genotyping performed by Ida Mains CNM student. Cervix was friable with EC brush.  Uterus is felt to be normal size, shape, and contour.  No adnexal masses or tenderness noted.Bladder is non tender, no masses felt. Extremities/musculoskeletal:  No swelling or varicosities noted, no clubbing or cyanosis Psych:  No mood changes, alert and cooperative,seems happy AA is 1 Fall risk is low    06/30/2023    9:33 AM 11/26/2021   10:34 AM 05/08/2020    1:28 PM  Depression screen PHQ 2/9  Decreased Interest 0 0 0  Down,  Depressed, Hopeless 0 0   PHQ - 2 Score 0 0 0  Altered sleeping 0 0   Tired, decreased energy 0 0   Change in appetite 0 0   Feeling bad or failure about yourself  0 1   Trouble concentrating 0 1   Moving slowly or fidgety/restless 0 0   Suicidal thoughts 0 0   PHQ-9 Score 0 2   Difficult doing work/chores Not difficult at all         06/30/2023    9:33 AM 11/26/2021   10:35 AM 05/08/2020    1:30 PM  GAD 7 : Generalized Anxiety Score  Nervous, Anxious, on Edge 0 0 0  Control/stop worrying 0 0 0  Worry too much - different things 1 0 0  Trouble relaxing 1 1 0  Restless 0 0 0  Easily annoyed or irritable 1 1 0  Afraid - awful might happen 0 0 0  Total GAD 7 Score 3 2 0    Upstream - 06/30/23 0933       Pregnancy Intention Screening   Does the patient want to become pregnant in the next year? No    Does the patient's partner want to become pregnant in the next year? No    Would the patient like to discuss contraceptive options today? No      Contraception Wrap Up   Current Method Vasectomy    End Method Vasectomy    Contraception Counseling Provided No  Co exam with Ida Mains CNM student  Impression and Plan: 1. Encounter for gynecological examination with Papanicolaou smear of cervix (Primary) Pap sent Pap in 3 years if normal Physical in 1 year Labs to do fasting later - Cytology - PAP( [place]) - CBC - Comprehensive metabolic panel with GFR - Lipid panel Mammogram at 40  2. Screening cholesterol level - Lipid panel  3. Screening for thyroid disorder - TSH + free T4  4. Screening for diabetes mellitus - Hemoglobin A1c  5. Depression with anxiety Good good with Wellbutrin  will refill Meds ordered this encounter  Medications   buPROPion  (WELLBUTRIN  SR) 150 MG 12 hr tablet    Sig: Take 1 tablet (150 mg total) by mouth daily.    Dispense:  90 tablet    Refill:  3    Supervising Provider:   Evalyn Hillier H [[id number]]

## 2023-07-02 LAB — CYTOLOGY - PAP
Comment: NEGATIVE
Diagnosis: NEGATIVE
High risk HPV: NEGATIVE

## 2023-07-03 ENCOUNTER — Ambulatory Visit: Payer: Self-pay | Admitting: Adult Health

## 2023-07-30 DIAGNOSIS — Z1329 Encounter for screening for other suspected endocrine disorder: Secondary | ICD-10-CM | POA: Diagnosis not present

## 2023-07-30 DIAGNOSIS — Z131 Encounter for screening for diabetes mellitus: Secondary | ICD-10-CM | POA: Diagnosis not present

## 2023-07-30 DIAGNOSIS — Z01419 Encounter for gynecological examination (general) (routine) without abnormal findings: Secondary | ICD-10-CM | POA: Diagnosis not present

## 2023-07-30 DIAGNOSIS — Z13220 Encounter for screening for lipoid disorders: Secondary | ICD-10-CM | POA: Diagnosis not present

## 2023-07-31 LAB — LIPID PANEL
Chol/HDL Ratio: 2.5 ratio (ref 0.0–4.4)
Cholesterol, Total: 165 mg/dL (ref 100–199)
HDL: 65 mg/dL (ref >39)
LDL Chol Calc (NIH): 87 mg/dL (ref 0–99)
Triglycerides: 65 mg/dL (ref 0–149)
VLDL Cholesterol Cal: 13 mg/dL (ref 5–40)

## 2023-07-31 LAB — COMPREHENSIVE METABOLIC PANEL WITH GFR
ALT: 29 IU/L (ref 0–32)
AST: 19 IU/L (ref 0–40)
Albumin: 4.5 g/dL (ref 3.9–4.9)
Alkaline Phosphatase: 107 IU/L (ref 44–121)
BUN/Creatinine Ratio: 12 (ref 9–23)
BUN: 11 mg/dL (ref 6–20)
Bilirubin Total: 0.5 mg/dL (ref 0.0–1.2)
CO2: 23 mmol/L (ref 20–29)
Calcium: 9.3 mg/dL (ref 8.7–10.2)
Chloride: 100 mmol/L (ref 96–106)
Creatinine, Ser: 0.89 mg/dL (ref 0.57–1.00)
Globulin, Total: 2.6 g/dL (ref 1.5–4.5)
Glucose: 88 mg/dL (ref 70–99)
Potassium: 4.1 mmol/L (ref 3.5–5.2)
Sodium: 137 mmol/L (ref 134–144)
Total Protein: 7.1 g/dL (ref 6.0–8.5)
eGFR: 86 mL/min/1.73 (ref >59)

## 2023-07-31 LAB — CBC
Hematocrit: 43.3 % (ref 34.0–46.6)
Hemoglobin: 14.1 g/dL (ref 11.1–15.9)
MCH: 31.1 pg (ref 26.6–33.0)
MCHC: 32.6 g/dL (ref 31.5–35.7)
MCV: 95 fL (ref 79–97)
Platelets: 194 x10E3/uL (ref 150–450)
RBC: 4.54 x10E6/uL (ref 3.77–5.28)
RDW: 12.4 % (ref 11.7–15.4)
WBC: 4.8 x10E3/uL (ref 3.4–10.8)

## 2023-07-31 LAB — HEMOGLOBIN A1C
Est. average glucose Bld gHb Est-mCnc: 94 mg/dL
Hgb A1c MFr Bld: 4.9 % (ref 4.8–5.6)

## 2023-07-31 LAB — TSH+FREE T4
Free T4: 1.41 ng/dL (ref 0.82–1.77)
TSH: 2.45 u[IU]/mL (ref 0.450–4.500)

## 2023-08-12 ENCOUNTER — Other Ambulatory Visit: Payer: Self-pay | Admitting: Adult Health

## 2023-08-12 MED ORDER — PHENTERMINE HCL 37.5 MG PO TABS: 37.5000 mg | ORAL_TABLET | Freq: Every day | ORAL | 0 refills | Status: DC

## 2023-08-12 NOTE — Progress Notes (Signed)
Will rx phentermine

## 2023-08-13 ENCOUNTER — Other Ambulatory Visit: Payer: Self-pay | Admitting: Adult Health

## 2023-08-13 MED ORDER — PHENTERMINE HCL 37.5 MG PO TABS: 37.5000 mg | ORAL_TABLET | Freq: Every day | ORAL | 0 refills | Status: DC

## 2023-08-13 NOTE — Progress Notes (Signed)
 Rx for phentermine  sent to [facility] [address]

## 2023-09-16 ENCOUNTER — Other Ambulatory Visit: Payer: Self-pay | Admitting: Adult Health

## 2023-09-16 MED ORDER — PHENTERMINE HCL 37.5 MG PO TABS: 37.5000 mg | ORAL_TABLET | Freq: Every day | ORAL | 0 refills | Status: AC

## 2023-09-16 NOTE — Progress Notes (Signed)
 Refill-phentermine

## 2023-10-20 ENCOUNTER — Other Ambulatory Visit: Payer: Self-pay | Admitting: Adult Health

## 2023-10-20 MED ORDER — SCOPOLAMINE 1 MG/3DAYS TD PT72: 1.0000 | MEDICATED_PATCH | TRANSDERMAL | 0 refills | Status: AC

## 2023-10-20 NOTE — Progress Notes (Signed)
 Rx scop patch
